# Patient Record
Sex: Male | Born: 1940 | Race: White | Hispanic: No | Marital: Married | Smoking: Former smoker
Health system: Southern US, Community
[De-identification: ages and names within clinical notes are randomized; demographics above are authoritative.]

## PROBLEM LIST (undated history)

## (undated) DIAGNOSIS — E079 Disorder of thyroid, unspecified: Secondary | ICD-10-CM

## (undated) DIAGNOSIS — I10 Essential (primary) hypertension: Secondary | ICD-10-CM

## (undated) DIAGNOSIS — F0280 Dementia in other diseases classified elsewhere without behavioral disturbance: Secondary | ICD-10-CM

## (undated) DIAGNOSIS — F0390 Unspecified dementia without behavioral disturbance: Secondary | ICD-10-CM

## (undated) DIAGNOSIS — N400 Enlarged prostate without lower urinary tract symptoms: Secondary | ICD-10-CM

## (undated) DIAGNOSIS — E7800 Pure hypercholesterolemia, unspecified: Secondary | ICD-10-CM

## (undated) DIAGNOSIS — G309 Alzheimer's disease, unspecified: Secondary | ICD-10-CM

---

## 1998-10-02 ENCOUNTER — Ambulatory Visit (HOSPITAL_COMMUNITY): Admission: RE | Admit: 1998-10-02 | Discharge: 1998-10-02 | Payer: Self-pay | Admitting: Family Medicine

## 2008-06-04 ENCOUNTER — Emergency Department (HOSPITAL_COMMUNITY): Admission: EM | Admit: 2008-06-04 | Discharge: 2008-06-04 | Payer: Self-pay | Admitting: Emergency Medicine

## 2011-05-28 LAB — DIFFERENTIAL
Basophils Absolute: 0
Basophils Relative: 0
Eosinophils Absolute: 0.2
Eosinophils Relative: 2
Lymphocytes Relative: 9 — ABNORMAL LOW
Lymphs Abs: 0.8
Monocytes Absolute: 0.4
Monocytes Relative: 4
Neutro Abs: 8.1 — ABNORMAL HIGH
Neutrophils Relative %: 85 — ABNORMAL HIGH

## 2011-05-28 LAB — BASIC METABOLIC PANEL WITH GFR
CO2: 26
Calcium: 8.7
Chloride: 105
Creatinine, Ser: 0.61
GFR calc non Af Amer: >60
Sodium: 139

## 2011-05-28 LAB — APTT: aPTT: 25

## 2011-05-28 LAB — PROTIME-INR
INR: 0.9
Prothrombin Time: 12.4

## 2011-05-28 LAB — BASIC METABOLIC PANEL
BUN: 11
GFR calc Af Amer: >60
Glucose, Bld: 95
Potassium: 3.6

## 2011-05-28 LAB — CBC
HCT: 43.3
Hemoglobin: 14.4
MCHC: 33.2
MCV: 92.1
Platelets: 273
RBC: 4.7
RDW: 13.6
WBC: 9.5

## 2011-05-28 LAB — POCT CARDIAC MARKERS
CKMB, poc: 1.1
CKMB, poc: <1 — ABNORMAL LOW
Myoglobin, poc: 40.3
Troponin i, poc: <0.05

## 2014-09-28 ENCOUNTER — Emergency Department (HOSPITAL_COMMUNITY)
Admission: EM | Admit: 2014-09-28 | Discharge: 2014-09-28 | Disposition: A | Discharge status: Home / Self Care | Payer: Medicare Other | Attending: Emergency Medicine | Admitting: Emergency Medicine

## 2014-09-28 ENCOUNTER — Emergency Department (HOSPITAL_COMMUNITY): Payer: Medicare Other

## 2014-09-28 ENCOUNTER — Encounter (HOSPITAL_COMMUNITY): Payer: Self-pay | Admitting: Emergency Medicine

## 2014-09-28 DIAGNOSIS — G309 Alzheimer's disease, unspecified: Secondary | ICD-10-CM | POA: Insufficient documentation

## 2014-09-28 DIAGNOSIS — N400 Enlarged prostate without lower urinary tract symptoms: Secondary | ICD-10-CM | POA: Diagnosis not present

## 2014-09-28 DIAGNOSIS — F0281 Dementia in other diseases classified elsewhere with behavioral disturbance: Secondary | ICD-10-CM | POA: Diagnosis not present

## 2014-09-28 DIAGNOSIS — E039 Hypothyroidism, unspecified: Secondary | ICD-10-CM | POA: Diagnosis not present

## 2014-09-28 DIAGNOSIS — Z87891 Personal history of nicotine dependence: Secondary | ICD-10-CM | POA: Insufficient documentation

## 2014-09-28 DIAGNOSIS — Z79899 Other long term (current) drug therapy: Secondary | ICD-10-CM | POA: Insufficient documentation

## 2014-09-28 DIAGNOSIS — I10 Essential (primary) hypertension: Secondary | ICD-10-CM | POA: Insufficient documentation

## 2014-09-28 DIAGNOSIS — E780 Pure hypercholesterolemia: Secondary | ICD-10-CM | POA: Diagnosis not present

## 2014-09-28 DIAGNOSIS — F0391 Unspecified dementia with behavioral disturbance: Secondary | ICD-10-CM

## 2014-09-28 DIAGNOSIS — E079 Disorder of thyroid, unspecified: Secondary | ICD-10-CM | POA: Diagnosis not present

## 2014-09-28 DIAGNOSIS — R7989 Other specified abnormal findings of blood chemistry: Secondary | ICD-10-CM | POA: Diagnosis present

## 2014-09-28 HISTORY — DX: Alzheimer's disease, unspecified: G30.9

## 2014-09-28 HISTORY — DX: Disorder of thyroid, unspecified: E07.9

## 2014-09-28 HISTORY — DX: Benign prostatic hyperplasia without lower urinary tract symptoms: N40.0

## 2014-09-28 HISTORY — DX: Unspecified dementia, unspecified severity, without behavioral disturbance, psychotic disturbance, mood disturbance, and anxiety: F03.90

## 2014-09-28 HISTORY — DX: Pure hypercholesterolemia, unspecified: E78.00

## 2014-09-28 HISTORY — DX: Essential (primary) hypertension: I10

## 2014-09-28 HISTORY — DX: Dementia in other diseases classified elsewhere, unspecified severity, without behavioral disturbance, psychotic disturbance, mood disturbance, and anxiety: F02.80

## 2014-09-28 LAB — CBC WITH DIFFERENTIAL/PLATELET
BASOS ABS: 0.1 10*3/uL (ref 0.0–0.1)
BASOS PCT: 1 % (ref 0–1)
EOS ABS: 0.3 10*3/uL (ref 0.0–0.7)
EOS PCT: 3 % (ref 0–5)
HCT: 43.5 % (ref 39.0–52.0)
Hemoglobin: 14.2 g/dL (ref 13.0–17.0)
Lymphocytes Relative: 18 % (ref 12–46)
Lymphs Abs: 1.5 10*3/uL (ref 0.7–4.0)
MCH: 30.1 pg (ref 26.0–34.0)
MCHC: 32.6 g/dL (ref 30.0–36.0)
MCV: 92.4 fL (ref 78.0–100.0)
MONOS PCT: 8 % (ref 3–12)
Monocytes Absolute: 0.7 10*3/uL (ref 0.1–1.0)
NEUTROS PCT: 70 % (ref 43–77)
Neutro Abs: 5.8 10*3/uL (ref 1.7–7.7)
PLATELETS: 278 10*3/uL (ref 150–400)
RBC: 4.71 MIL/uL (ref 4.22–5.81)
RDW: 14.1 % (ref 11.5–15.5)
WBC: 8.4 10*3/uL (ref 4.0–10.5)

## 2014-09-28 LAB — RAPID URINE DRUG SCREEN, HOSP PERFORMED
AMPHETAMINES: NOT DETECTED
BENZODIAZEPINES: POSITIVE — AB
Barbiturates: NOT DETECTED
COCAINE: NOT DETECTED
OPIATES: NOT DETECTED
TETRAHYDROCANNABINOL: NOT DETECTED

## 2014-09-28 LAB — ACETAMINOPHEN LEVEL: Acetaminophen (Tylenol), Serum: <10 ug/mL — ABNORMAL LOW (ref 10–30)

## 2014-09-28 LAB — COMPREHENSIVE METABOLIC PANEL
ALK PHOS: 71 U/L (ref 39–117)
ALT: 18 U/L (ref 0–53)
ANION GAP: 7 (ref 5–15)
AST: 21 U/L (ref 0–37)
Albumin: 3.9 g/dL (ref 3.5–5.2)
BILIRUBIN TOTAL: 0.7 mg/dL (ref 0.3–1.2)
BUN: 26 mg/dL — AB (ref 6–23)
CO2: 26 mmol/L (ref 19–32)
CREATININE: 0.76 mg/dL (ref 0.50–1.35)
Calcium: 8.4 mg/dL (ref 8.4–10.5)
Chloride: 105 mmol/L (ref 96–112)
GFR, EST NON AFRICAN AMERICAN: 88 mL/min — AB (ref >90)
Glucose, Bld: 115 mg/dL — ABNORMAL HIGH (ref 70–99)
POTASSIUM: 3.6 mmol/L (ref 3.5–5.1)
Sodium: 138 mmol/L (ref 135–145)
Total Protein: 7 g/dL (ref 6.0–8.3)

## 2014-09-28 LAB — URINALYSIS, ROUTINE W REFLEX MICROSCOPIC
BILIRUBIN URINE: NEGATIVE
GLUCOSE, UA: NEGATIVE mg/dL
Hgb urine dipstick: NEGATIVE
KETONES UR: NEGATIVE mg/dL
LEUKOCYTES UA: NEGATIVE
NITRITE: NEGATIVE
PH: 5 (ref 5.0–8.0)
PROTEIN: NEGATIVE mg/dL
SPECIFIC GRAVITY, URINE: 1.027 (ref 1.005–1.030)
UROBILINOGEN UA: 0.2 mg/dL (ref 0.0–1.0)

## 2014-09-28 LAB — ETHANOL: Alcohol, Ethyl (B): <5 mg/dL (ref 0–9)

## 2014-09-28 LAB — SALICYLATE LEVEL

## 2014-09-28 LAB — TSH: TSH: 260 u[IU]/mL — ABNORMAL HIGH (ref 0.350–4.500)

## 2014-09-28 MED ORDER — LEVOTHYROXINE SODIUM 150 MCG PO TABS: 150.0000 ug | ORAL_TABLET | Freq: Every day | ORAL | Status: DC

## 2014-09-28 NOTE — ED Notes (Signed)
Pt wife states her husband's doctor's office called and told them to report to the ER d/t elevated thyroid levels. Wife states her husband is having worsening problems with wandering off from his dementia, but is not combative or agitated.

## 2014-09-28 NOTE — Discharge Instructions (Signed)
Change synthroid to 150 mcg daily. Please make sure that he takes his meds.   Get repeat TSH in a month.   See your doctor.   Return to ER if you have worse aggressive behavior, not behaving normally.

## 2014-09-28 NOTE — Progress Notes (Signed)
  CARE MANAGEMENT ED NOTE 09/28/2014  Patient:  Steve Hood,Steve Hood   Account Number:  [id number]  Date Initiated:  09/28/2014  Documentation initiated by:  Edd ArbourGIBBS,Dejanira Pamintuan  Subjective/Objective Assessment:   74 yr old self pay [place] pt doctor's office called and told them to report to the ER d/t elevated thyroid levels. Wife states her husband is having worsening problems with wandering off from his dementia, but is not combative or a     Subjective/Objective Assessment Detail:   Family informed cm they had an appointment with home instead but had to come to the emergency room  pcp is [facility]  Pt supported by his 2 male and 1 male family member     Action/Plan:   consulted by ED RN for assist with PDN & possible home health Cm spoke with family see notes below pcp updated   Action/Plan Detail:   Anticipated DC Date:       Status Recommendation to Physician:   Result of Recommendation:    Other ED Services  Consult Working Plan    DC [employer]  Other  Outpatient Services - Pt will follow up    Choice offered to / List presented to:            Status of service:  Completed, signed off  ED Comments:   ED Comments Detail:  CM reviewed in details medicare guidelines, home health (HH) (length of stay in home, [facility], coverage, primary caregiver, up to 24 hrs before services may be started), Private duty nursing (PDN-coverage, length of stay in the home types of staff available),  CM provided family with a list of [place] PDN. Refused home health service information

## 2014-09-28 NOTE — ED Provider Notes (Addendum)
CSN: [id number]     Arrival date & time 09/28/14  1453 History   First MD Initiated Contact with Patient 09/28/14 1505     Chief Complaint  Patient presents with  . Abnormal labs      (Consider location/radiation/quality/duration/timing/severity/associated sxs/prior Treatment) The history is provided by the patient and the spouse.  Larnell Netta NeatJ Menden is a 74 y.o. male hx of HTN, dementia, hypothyroidism here with abnormal labs. He had a physical end of December. Had thyroid test done last week and was abnormal. PMD called patient and sent patient to the ED for evaluation. Patient has dementia and lives at home with wife. She states that he has been more agitated at home. However, he is not aggressive towards her and hasn't destroyed any properties. He has been more forgetful recently. He did wander outside of the home recently. He has been refusing to go to see his doctors.     Past Medical History  Diagnosis Date  . Hypertension   . Thyroid disease   . Dementia alzh  . Alzheimer disease   . Benign prostate hyperplasia   . High cholesterol    History reviewed. No pertinent past surgical history. History reviewed. No pertinent family history. History  Substance Use Topics  . Smoking status: Former Games developermoker  . Smokeless tobacco: Not on file  . Alcohol Use: Not on file    Review of Systems  Psychiatric/Behavioral: Positive for behavioral problems and confusion.  All other systems reviewed and are negative.     Allergies  Review of patient's allergies indicates no known allergies.  Home Medications   Prior to Admission medications   Medication Sig Start Date End Date Taking? Authorizing Provider  ALPRAZolam Prudy Feeler(XANAX) 1 MG tablet Take 1 mg by mouth 2 (two) times daily as needed for anxiety.  08/11/14  Yes Historical Provider, MD  dutasteride (AVODART) 0.5 MG capsule Take 0.5 mg by mouth at bedtime.  06/04/13  Yes Historical Provider, MD  levothyroxine (SYNTHROID, LEVOTHROID) 112  MCG tablet Take 112 mcg by mouth daily before breakfast.  09/22/14  Yes Historical Provider, MD  lisinopril-hydrochlorothiazide (PRINZIDE,ZESTORETIC) 20-12.5 MG per tablet Take 1 tablet by mouth at bedtime.  06/23/14  Yes Historical Provider, MD  simvastatin (ZOCOR) 20 MG tablet Take 20 mg by mouth at bedtime.  06/23/14  Yes Historical Provider, MD  zolpidem (AMBIEN) 10 MG tablet Take 10 mg by mouth at bedtime as needed for sleep.  09/10/14  Yes Historical Provider, MD   BP 144/84 mmHg  Pulse 102  Temp(Src) 98 F (36.7 C) (Oral)  Resp 18  SpO2 96% Physical Exam  Constitutional:  Demented, NAD   HENT:  Head: Normocephalic.  Mouth/Throat: Oropharynx is clear and moist.  Eyes: Conjunctivae are normal. Pupils are equal, round, and reactive to light.  Neck: Normal range of motion. Neck supple.  Cardiovascular: Normal rate, regular rhythm, normal heart sounds and intact distal pulses.   Pulmonary/Chest: Effort normal and breath sounds normal. No respiratory distress. He has no wheezes. He has no rales.  Abdominal: Soft. Bowel sounds are normal. He exhibits no distension. There is no tenderness. There is no rebound and no guarding.  Musculoskeletal: Normal range of motion. He exhibits no edema or tenderness.  Neurological: He is alert.  Demented, nl strength throughout.   Skin: Skin is warm and dry.  Psychiatric:  Calm, NAD   Nursing note and vitals reviewed.   ED Course  Procedures (including critical care time) Labs Review Labs Reviewed  COMPREHENSIVE METABOLIC PANEL - Abnormal; Notable for the following:    Glucose, Bld 115 (*)    BUN 26 (*)    GFR calc non Af Amer 88 (*)    All other components within normal limits  ACETAMINOPHEN LEVEL - Abnormal; Notable for the following:    Acetaminophen (Tylenol), Serum <10.0 (*)    All other components within normal limits  CBC WITH DIFFERENTIAL/PLATELET  URINALYSIS, ROUTINE W REFLEX MICROSCOPIC  ETHANOL  SALICYLATE LEVEL  TSH  T4,  FREE  T3  URINE RAPID DRUG SCREEN (HOSP PERFORMED)    Imaging Review Dg Chest 2 View  09/28/2014   CLINICAL DATA:  74 year old male with dementia.  EXAM: CHEST  2 VIEW  COMPARISON:  Chest x-ray 06/04/2008.  FINDINGS: Mild diffuse peribronchial cuffing. Lung volumes are low. No consolidative airspace disease. No pleural effusions. No pneumothorax. No pulmonary nodule or mass noted. Pulmonary vasculature and the cardiomediastinal silhouette are within normal limits.  IMPRESSION: 1. Mild diffuse peribronchial cuffing may suggest bronchitis. 2. Low lung volumes.   Electronically Signed   By: Trudie Reed M.D.   On: 09/28/2014 15:57   Ct Head Wo Contrast  09/28/2014   CLINICAL DATA:  Altered mental status with progressive dementia. Thyrotoxicosis. Initial encounter.  EXAM: CT HEAD WITHOUT CONTRAST  TECHNIQUE: Contiguous axial images were obtained from the base of the skull through the vertex without intravenous contrast.  COMPARISON:  None.  FINDINGS: There is no evidence of acute intracranial hemorrhage, mass lesion, brain edema or extra-axial fluid collection. The ventricles and subarachnoid spaces are mildly prominent. There is no CT evidence of acute cortical infarction. There is minimal periventricular white matter disease, likely reflecting chronic small vessel ischemic change. Intracranial vascular calcifications are noted.  The visualized paranasal sinuses, mastoid air cells and middle ears are clear. The calvarium is intact.  IMPRESSION: Mild atrophy and periventricular white matter disease. No acute intracranial findings.   Electronically Signed   By: Roxy Horseman M.D.   On: 09/28/2014 15:59     EKG Interpretation None      MDM   Final diagnoses:  None    HAMILTON MARINELLO is a 74 y.o. male here with elevated TSH. I called Dr. Modesta Messing office and discussed with her. Recent TSH was 249 then repeat was 310. There is no T3 or T4 sent. She feels that he has been more agitated at home and  refused neuro eval and recommend MRI. I called Dr. Thad Ranger, neurology, who states that given abnormal TSH, there is no need for further neuro workup until the TSH normalized. I called Dr. Tedd Sias, endocrinology,who recommend increase synthroid to 150 mcg daily. She states that he is likely not taking his meds. I offered admission but wife doesn't want him placed. She feels safe at home. She wants to take him home. She is getting home health to evaluate and help her at home. Labs at baseline. Will dc home.    Richardean Canal, MD 09/28/14 1821  09/29/14 3pm I followed up labs I sent yesterday. TSH 260. T3 and free T4 nl. No signs of myedema coma on my exam yesterday. I have already increased his synthroid. He can get repeat level checked in 3-4 weeks.      Richardean Canal, MD 09/29/14 1500

## 2014-09-29 LAB — T4, FREE: Free T4: 1.37 ng/dL (ref 0.80–1.80)

## 2014-09-29 LAB — T3: T3 TOTAL: 81 ng/dL (ref 71–180)

## 2014-12-05 ENCOUNTER — Encounter (HOSPITAL_COMMUNITY): Payer: Self-pay | Admitting: *Deleted

## 2014-12-05 ENCOUNTER — Emergency Department (HOSPITAL_COMMUNITY)
Admission: EM | Admit: 2014-12-05 | Discharge: 2014-12-06 | Disposition: A | Discharge status: Other Acute Inpatient Hospital | Payer: Medicare Other | Attending: Emergency Medicine | Admitting: Emergency Medicine

## 2014-12-05 DIAGNOSIS — Z87891 Personal history of nicotine dependence: Secondary | ICD-10-CM | POA: Diagnosis not present

## 2014-12-05 DIAGNOSIS — R4689 Other symptoms and signs involving appearance and behavior: Secondary | ICD-10-CM

## 2014-12-05 DIAGNOSIS — G309 Alzheimer's disease, unspecified: Secondary | ICD-10-CM | POA: Insufficient documentation

## 2014-12-05 DIAGNOSIS — N400 Enlarged prostate without lower urinary tract symptoms: Secondary | ICD-10-CM | POA: Diagnosis not present

## 2014-12-05 DIAGNOSIS — E079 Disorder of thyroid, unspecified: Secondary | ICD-10-CM | POA: Diagnosis not present

## 2014-12-05 DIAGNOSIS — F0391 Unspecified dementia with behavioral disturbance: Secondary | ICD-10-CM | POA: Diagnosis not present

## 2014-12-05 DIAGNOSIS — F1310 Sedative, hypnotic or anxiolytic abuse, uncomplicated: Secondary | ICD-10-CM | POA: Diagnosis not present

## 2014-12-05 DIAGNOSIS — F911 Conduct disorder, childhood-onset type: Secondary | ICD-10-CM | POA: Insufficient documentation

## 2014-12-05 DIAGNOSIS — I10 Essential (primary) hypertension: Secondary | ICD-10-CM | POA: Diagnosis not present

## 2014-12-05 DIAGNOSIS — F0280 Dementia in other diseases classified elsewhere without behavioral disturbance: Secondary | ICD-10-CM

## 2014-12-05 DIAGNOSIS — E780 Pure hypercholesterolemia: Secondary | ICD-10-CM | POA: Diagnosis not present

## 2014-12-05 DIAGNOSIS — Z046 Encounter for general psychiatric examination, requested by authority: Secondary | ICD-10-CM | POA: Diagnosis present

## 2014-12-05 DIAGNOSIS — F03918 Unspecified dementia, unspecified severity, with other behavioral disturbance: Secondary | ICD-10-CM | POA: Diagnosis present

## 2014-12-05 DIAGNOSIS — Z79899 Other long term (current) drug therapy: Secondary | ICD-10-CM | POA: Diagnosis not present

## 2014-12-05 LAB — COMPREHENSIVE METABOLIC PANEL
ALK PHOS: 73 U/L (ref 39–117)
ALT: 20 U/L (ref 0–53)
AST: 23 U/L (ref 0–37)
Albumin: 4.2 g/dL (ref 3.5–5.2)
Anion gap: 8 (ref 5–15)
BILIRUBIN TOTAL: 0.8 mg/dL (ref 0.3–1.2)
BUN: 11 mg/dL (ref 6–23)
CO2: 26 mmol/L (ref 19–32)
Calcium: 8.7 mg/dL (ref 8.4–10.5)
Chloride: 100 mmol/L (ref 96–112)
Creatinine, Ser: 0.62 mg/dL (ref 0.50–1.35)
GFR calc Af Amer: >90 mL/min (ref >90)
GFR calc non Af Amer: >90 mL/min (ref >90)
Glucose, Bld: 116 mg/dL — ABNORMAL HIGH (ref 70–99)
POTASSIUM: 4 mmol/L (ref 3.5–5.1)
SODIUM: 134 mmol/L — AB (ref 135–145)
Total Protein: 7.6 g/dL (ref 6.0–8.3)

## 2014-12-05 LAB — URINALYSIS, ROUTINE W REFLEX MICROSCOPIC
BILIRUBIN URINE: NEGATIVE
GLUCOSE, UA: NEGATIVE mg/dL
HGB URINE DIPSTICK: NEGATIVE
Ketones, ur: 15 mg/dL — AB
Leukocytes, UA: NEGATIVE
NITRITE: NEGATIVE
Protein, ur: NEGATIVE mg/dL
Specific Gravity, Urine: 1.012 (ref 1.005–1.030)
Urobilinogen, UA: 1 mg/dL (ref 0.0–1.0)
pH: 7 (ref 5.0–8.0)

## 2014-12-05 LAB — RAPID URINE DRUG SCREEN, HOSP PERFORMED
AMPHETAMINES: NOT DETECTED
BARBITURATES: NOT DETECTED
BENZODIAZEPINES: POSITIVE — AB
Cocaine: NOT DETECTED
Opiates: NOT DETECTED
Tetrahydrocannabinol: NOT DETECTED

## 2014-12-05 LAB — SALICYLATE LEVEL

## 2014-12-05 LAB — CBC
HCT: 43 % (ref 39.0–52.0)
Hemoglobin: 14.3 g/dL (ref 13.0–17.0)
MCH: 30.7 pg (ref 26.0–34.0)
MCHC: 33.3 g/dL (ref 30.0–36.0)
MCV: 92.3 fL (ref 78.0–100.0)
PLATELETS: 309 10*3/uL (ref 150–400)
RBC: 4.66 MIL/uL (ref 4.22–5.81)
RDW: 13.2 % (ref 11.5–15.5)
WBC: 14.5 10*3/uL — AB (ref 4.0–10.5)

## 2014-12-05 LAB — ACETAMINOPHEN LEVEL: Acetaminophen (Tylenol), Serum: <10 ug/mL — ABNORMAL LOW (ref 10–30)

## 2014-12-05 LAB — ETHANOL: Alcohol, Ethyl (B): <5 mg/dL (ref 0–9)

## 2014-12-05 MED ORDER — SIMVASTATIN 20 MG PO TABS
20.0000 mg | ORAL_TABLET | Freq: Every day | ORAL | Status: DC
  Administered 2014-12-05: 20 mg via ORAL
  Filled 2014-12-05 (×3): qty 1

## 2014-12-05 MED ORDER — LEVOTHYROXINE SODIUM 150 MCG PO TABS
150.0000 ug | ORAL_TABLET | Freq: Every day | ORAL | Status: DC
  Administered 2014-12-06: 150 ug via ORAL
  Filled 2014-12-05 (×2): qty 1

## 2014-12-05 MED ORDER — DUTASTERIDE 0.5 MG PO CAPS
0.5000 mg | ORAL_CAPSULE | Freq: Every day | ORAL | Status: DC
  Administered 2014-12-05: 0.5 mg via ORAL
  Filled 2014-12-05 (×3): qty 1

## 2014-12-05 MED ORDER — ZOLPIDEM TARTRATE 10 MG PO TABS: 10.0000 mg | ORAL_TABLET | Freq: Every evening | ORAL | Status: DC | PRN

## 2014-12-05 MED ORDER — LISINOPRIL-HYDROCHLOROTHIAZIDE 20-12.5 MG PO TABS: 1.0000 | ORAL_TABLET | Freq: Every day | ORAL | Status: DC

## 2014-12-05 MED ORDER — ONDANSETRON HCL 4 MG PO TABS: 4.0000 mg | ORAL_TABLET | Freq: Three times a day (TID) | ORAL | Status: DC | PRN

## 2014-12-05 MED ORDER — TRAZODONE HCL 100 MG PO TABS
100.0000 mg | ORAL_TABLET | Freq: Every day | ORAL | Status: DC
  Administered 2014-12-05: 100 mg via ORAL
  Filled 2014-12-05: qty 1

## 2014-12-05 MED ORDER — ALPRAZOLAM 0.5 MG PO TABS
1.0000 mg | ORAL_TABLET | Freq: Two times a day (BID) | ORAL | Status: DC | PRN
  Administered 2014-12-05: 1 mg via ORAL
  Filled 2014-12-05: qty 2

## 2014-12-05 MED ORDER — HYDROCHLOROTHIAZIDE 12.5 MG PO CAPS
12.5000 mg | ORAL_CAPSULE | Freq: Every day | ORAL | Status: DC
  Administered 2014-12-05 – 2014-12-06 (×2): 12.5 mg via ORAL
  Filled 2014-12-05 (×2): qty 1

## 2014-12-05 MED ORDER — LISINOPRIL 20 MG PO TABS
20.0000 mg | ORAL_TABLET | Freq: Every day | ORAL | Status: DC
  Administered 2014-12-05 – 2014-12-06 (×2): 20 mg via ORAL
  Filled 2014-12-05 (×2): qty 1

## 2014-12-05 MED ORDER — ACETAMINOPHEN 325 MG PO TABS: 650.0000 mg | ORAL_TABLET | ORAL | Status: DC | PRN
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
## 2014-12-05 NOTE — ED Notes (Signed)
[facility] Expected date:  Expected time:  Means of arrival:  Comments: ivc

## 2014-12-05 NOTE — BH Assessment (Signed)
Assessment completed. Consulted Donell SievertSpencer Simon, PA-C who recommended that pt have a psych eval in the morning. Dr. Blinda LeatherwoodPollina has been informed of the recommendation.
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
## 2014-12-06 DIAGNOSIS — G309 Alzheimer's disease, unspecified: Secondary | ICD-10-CM | POA: Diagnosis not present

## 2014-12-06 DIAGNOSIS — F0391 Unspecified dementia with behavioral disturbance: Secondary | ICD-10-CM

## 2014-12-06 DIAGNOSIS — R4689 Other symptoms and signs involving appearance and behavior: Secondary | ICD-10-CM | POA: Insufficient documentation

## 2014-12-06 DIAGNOSIS — F03918 Unspecified dementia, unspecified severity, with other behavioral disturbance: Secondary | ICD-10-CM | POA: Diagnosis present

## 2014-12-06 MED ORDER — ALPRAZOLAM 0.5 MG PO TABS
0.5000 mg | ORAL_TABLET | Freq: Two times a day (BID) | ORAL | Status: DC | PRN
  Administered 2014-12-06: 0.5 mg via ORAL
  Filled 2014-12-06: qty 1

## 2014-12-06 MED ORDER — CITALOPRAM HYDROBROMIDE 10 MG PO TABS
10.0000 mg | ORAL_TABLET | Freq: Every day | ORAL | Status: DC
  Administered 2014-12-06: 10 mg via ORAL
  Filled 2014-12-06: qty 1

## 2014-12-06 MED ORDER — TRAZODONE HCL 50 MG PO TABS: 50.0000 mg | ORAL_TABLET | Freq: Every day | ORAL | Status: DC
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
## 2014-12-06 NOTE — Progress Notes (Signed)
TTS Counselor faxed referral information to the following facilities for this patient: [facility] [address] [facility] [facility]. This information is also reflected on the shift report.

## 2014-12-06 NOTE — ED Notes (Signed)
Daughter at bedside.

## 2014-12-06 NOTE — ED Notes (Signed)
Pt continues to get out of bed. Attempting distracting activities. Pt will follow direct commands but needs constant reminders.
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
## 2014-12-06 NOTE — Progress Notes (Addendum)
[facility] Assessment Progress Note  Faxed copy of 1st opinion, chest x-ray, and [facility], attn [facility], per her request for further consideration for pt placement.    [facility] been [facility]. Accepting physician is Circuit CityBen Palombo. Number to call report is [phone]. Information given to pt's nurse, Shanda BumpsJessica.   Marcelle SmilingSamantha Maleya Leever, MS Counselor

## 2014-12-06 NOTE — ED Notes (Signed)
Pt alert laughing and dancing to music. Sitter remains at side for safety.

## 2014-12-06 NOTE — ED Notes (Signed)
Pt's wife has called to check on pt.  Will try and come by later this am.

## 2014-12-06 NOTE — ED Notes (Signed)
Pt ambulating in room. Concerned about when wife will return. Attempt comfort pt. Appears agitated.

## 2014-12-06 NOTE — ED Notes (Signed)
Pt came out of room and stated he was going to see his God.  Pt redirected to his room.  Safety sitter remains in room with patient.

## 2014-12-06 NOTE — ED Provider Notes (Signed)
  Physical Exam  BP 124/84 mmHg  Pulse 88  Temp(Src) 98.3 F (36.8 C) (Oral)  Resp 15  SpO2 97%  Physical Exam  ED Course  Procedures  [facility], Dr Nicanor AlconPalumbo.      Benjiman CoreNathan Maelys Kinnick, MD 12/06/14 [phone]

## 2014-12-06 NOTE — ED Notes (Signed)
Updated family on plan of care. Wife at bedside. Pt alert and pleasant.

## 2016-01-13 ENCOUNTER — Encounter (HOSPITAL_COMMUNITY): Payer: Self-pay | Admitting: Emergency Medicine

## 2016-01-13 ENCOUNTER — Emergency Department (HOSPITAL_COMMUNITY): Payer: Medicare Other

## 2016-01-13 ENCOUNTER — Emergency Department (HOSPITAL_COMMUNITY)
Admission: EM | Admit: 2016-01-13 | Discharge: 2016-01-14 | Disposition: A | Discharge status: Home / Self Care | Payer: Medicare Other | Attending: Emergency Medicine | Admitting: Emergency Medicine

## 2016-01-13 DIAGNOSIS — F0280 Dementia in other diseases classified elsewhere without behavioral disturbance: Secondary | ICD-10-CM | POA: Diagnosis not present

## 2016-01-13 DIAGNOSIS — Y9289 Other specified places as the place of occurrence of the external cause: Secondary | ICD-10-CM | POA: Insufficient documentation

## 2016-01-13 DIAGNOSIS — I10 Essential (primary) hypertension: Secondary | ICD-10-CM | POA: Insufficient documentation

## 2016-01-13 DIAGNOSIS — W1830XA Fall on same level, unspecified, initial encounter: Secondary | ICD-10-CM | POA: Diagnosis not present

## 2016-01-13 DIAGNOSIS — Z79899 Other long term (current) drug therapy: Secondary | ICD-10-CM | POA: Diagnosis not present

## 2016-01-13 DIAGNOSIS — Y999 Unspecified external cause status: Secondary | ICD-10-CM | POA: Insufficient documentation

## 2016-01-13 DIAGNOSIS — Y939 Activity, unspecified: Secondary | ICD-10-CM | POA: Diagnosis not present

## 2016-01-13 DIAGNOSIS — G309 Alzheimer's disease, unspecified: Secondary | ICD-10-CM | POA: Insufficient documentation

## 2016-01-13 DIAGNOSIS — Z87891 Personal history of nicotine dependence: Secondary | ICD-10-CM | POA: Insufficient documentation

## 2016-01-13 DIAGNOSIS — F0390 Unspecified dementia without behavioral disturbance: Secondary | ICD-10-CM | POA: Diagnosis present

## 2016-01-13 DIAGNOSIS — W19XXXA Unspecified fall, initial encounter: Secondary | ICD-10-CM

## 2016-01-13 NOTE — Discharge Instructions (Signed)
°  Your CT scan does not show serious head or neck injury.  Return for worsening symptoms, including confusion, inability to ambulate, or any symptoms concerning to you.

## 2016-01-13 NOTE — ED Notes (Signed)
Pt comes from a facility off [address], [facility], pt had a un witnessed fall 30 mins ago, pt denies pain, hx dementia, able to ambulate with walker. Ems placed spinal precautions, baseline is per staff. Pt is able to answer yes or no questions.  Vs on arrival 96/62, hr 80, sp02 95,

## 2016-01-13 NOTE — ED Provider Notes (Signed)
CSN: [id number]     Arrival date & time 01/13/16  2100 History   First MD Initiated Contact with Patient 01/13/16 2132     Chief Complaint  Patient presents with  . Fall  . Dementia    baseline      (Consider location/radiation/quality/duration/timing/severity/associated sxs/prior Treatment) HPI  Level V caveat due to dementia.  75 year old male who presents after fall. He has a history of hypertension and Alzheimer's dementia. He does not take any anticoagulation. According to EMS, patient presents from [facility]. He was found on the ground by nursing staff and had unwitnessed fall. He is at his baseline mental status per staff. He states that he remembers falling, but does not recall the details around this. He denies headache, numbness or weakness, neck pain, nausea or vomiting, vision or speech changes, shortness of breath, chest pain, abdominal pain, back pain, or any injury.    Past Medical History  Diagnosis Date  . Hypertension   . Thyroid disease   . Dementia alzh  . Alzheimer disease   . Benign prostate hyperplasia   . High cholesterol    History reviewed. No pertinent past surgical history. History reviewed. No pertinent family history. Social History  Substance Use Topics  . Smoking status: Former Games developermoker  . Smokeless tobacco: None  . Alcohol Use: None    Review of Systems 10/14 systems reviewed and are negative other than those stated in the HPI    Allergies  Review of patient's allergies indicates no known allergies.  Home Medications   Prior to Admission medications   Medication Sig Start Date End Date Taking? Authorizing Provider  ALPRAZolam Prudy Feeler(XANAX) 1 MG tablet Take 1 mg by mouth 3 (three) times daily as needed for anxiety (anxiety).  08/11/14  Yes Historical Provider, MD  divalproex (DEPAKOTE SPRINKLE) 125 MG capsule Take 375 mg by mouth 4 (four) times daily -  with meals and at bedtime.    Yes Historical Provider, MD  docusate sodium  (COLACE) 100 MG capsule Take 100 mg by mouth 2 (two) times daily.   Yes Historical Provider, MD  finasteride (PROSCAR) 5 MG tablet Take 5 mg by mouth daily.   Yes Historical Provider, MD  furosemide (LASIX) 20 MG tablet Take 10 mg by mouth daily.   Yes Historical Provider, MD  levothyroxine (SYNTHROID, LEVOTHROID) 125 MCG tablet Take 125 mcg by mouth daily before breakfast.   Yes Historical Provider, MD  lisinopril (PRINIVIL,ZESTRIL) 5 MG tablet Take 5 mg by mouth daily.   Yes Historical Provider, MD  mirtazapine (REMERON) 30 MG tablet Take 30 mg by mouth at bedtime.   Yes Historical Provider, MD  risperiDONE (RISPERDAL) 0.5 MG tablet Take 0.5 mg by mouth 2 (two) times daily.   Yes Historical Provider, MD  simvastatin (ZOCOR) 20 MG tablet Take 20 mg by mouth at bedtime.  06/23/14  Yes Historical Provider, MD  tamsulosin (FLOMAX) 0.4 MG CAPS capsule Take 0.4 mg by mouth daily.   Yes Historical Provider, MD  levothyroxine (SYNTHROID) 150 MCG tablet Take 1 tablet (150 mcg total) by mouth daily before breakfast. Patient not taking: Reported on 01/13/2016 09/28/14   Richardean Canalavid H Yao, MD   BP 96/61 mmHg  Pulse 84  Temp(Src) 98.3 F (36.8 C) (Oral)  Resp 16  SpO2 93% Physical Exam Physical Exam  Nursing note and vitals reviewed. Constitutional: Well developed, well nourished, non-toxic, and in no acute distress Head: Normocephalic and atraumatic.  Mouth/Throat: Oropharynx is clear and moist.  Neck:  Normal range of motion. Neck supple. No cervical spine tenderness. Cardiovascular: Normal rate and regular rhythm.   Pulmonary/Chest: Effort normal and breath sounds normal. No chest wall tenderness. Abdominal: Soft. There is no tenderness. There is no rebound and no guarding.  Musculoskeletal: Normal range of motion.  Neurological: Alert, no facial droop, fluent speech, moves all extremities symmetrically Skin: Skin is warm and dry.  Psychiatric: Cooperative  ED Course  Procedures (including critical  care time) Labs Review Labs Reviewed - No data to display  Imaging Review No results found. I have personally reviewed and evaluated these images and lab results as part of my medical decision-making.   EKG Interpretation None      MDM   Final diagnoses:  Fall, initial encounter  Alzheimer's dementia without behavioral disturbance, unspecified timing of dementia onset    75 year old male with history of Alzheimer's dementia who presents after unwitnessed fall. On presentation, he is well-appearing and in no acute distress. Without evidence of any severe injury. he does have cervical collar on. he is at his baseline mental status per family. We'll obtain CT head and cervical spine given age. No other injuries noted or suspected. Imaging studies signed out to oncoming physician. IF imaging studies negative and patient able to ambulate, will be discharged back to facility.   Lavera Guise, MD 01/14/16 [phone]

## 2016-01-14 NOTE — ED Provider Notes (Signed)
Pt with negative Ct imaging He is able to ambulate No acute issues at this time Will d/c home  Zadie Rhineonald Jilliana Burkes, MD 01/14/16 0139

## 2016-02-21 ENCOUNTER — Emergency Department (HOSPITAL_COMMUNITY)
Admission: EM | Admit: 2016-02-21 | Discharge: 2016-02-21 | Disposition: A | Discharge status: Home / Self Care | Payer: Medicare Other | Attending: Emergency Medicine | Admitting: Emergency Medicine

## 2016-02-21 ENCOUNTER — Encounter (HOSPITAL_COMMUNITY): Payer: Self-pay | Admitting: Emergency Medicine

## 2016-02-21 DIAGNOSIS — I10 Essential (primary) hypertension: Secondary | ICD-10-CM | POA: Insufficient documentation

## 2016-02-21 DIAGNOSIS — W19XXXA Unspecified fall, initial encounter: Secondary | ICD-10-CM | POA: Diagnosis not present

## 2016-02-21 DIAGNOSIS — Y939 Activity, unspecified: Secondary | ICD-10-CM | POA: Insufficient documentation

## 2016-02-21 DIAGNOSIS — G309 Alzheimer's disease, unspecified: Secondary | ICD-10-CM | POA: Insufficient documentation

## 2016-02-21 DIAGNOSIS — Y999 Unspecified external cause status: Secondary | ICD-10-CM | POA: Insufficient documentation

## 2016-02-21 DIAGNOSIS — Y929 Unspecified place or not applicable: Secondary | ICD-10-CM | POA: Insufficient documentation

## 2016-02-21 DIAGNOSIS — Z87891 Personal history of nicotine dependence: Secondary | ICD-10-CM | POA: Insufficient documentation

## 2016-02-21 DIAGNOSIS — Z048 Encounter for examination and observation for other specified reasons: Secondary | ICD-10-CM | POA: Diagnosis present

## 2016-02-21 DIAGNOSIS — E860 Dehydration: Secondary | ICD-10-CM | POA: Diagnosis not present

## 2016-02-21 DIAGNOSIS — Z79899 Other long term (current) drug therapy: Secondary | ICD-10-CM | POA: Insufficient documentation

## 2016-02-21 LAB — I-STAT CHEM 8, ED
BUN: 30 mg/dL — AB (ref 6–20)
Calcium, Ion: 1.17 mmol/L (ref 1.12–1.23)
Chloride: 107 mmol/L (ref 101–111)
Creatinine, Ser: 0.8 mg/dL (ref 0.61–1.24)
GLUCOSE: 88 mg/dL (ref 65–99)
HCT: 38 % — ABNORMAL LOW (ref 39.0–52.0)
HEMOGLOBIN: 12.9 g/dL — AB (ref 13.0–17.0)
Potassium: 4.6 mmol/L (ref 3.5–5.1)
Sodium: 144 mmol/L (ref 135–145)
TCO2: 29 mmol/L (ref 0–100)

## 2016-02-21 MED ORDER — SODIUM CHLORIDE 0.9 % IV BOLUS (SEPSIS)
500.0000 mL | Freq: Once | INTRAVENOUS | Status: AC
  Administered 2016-02-21: 500 mL via INTRAVENOUS

## 2016-02-21 NOTE — ED Notes (Signed)
Bed: [id number] Expected date:  Expected time:  Means of arrival:  Comments: EMS- 75yo M, fall/no complaint/dementia

## 2016-02-21 NOTE — ED Notes (Signed)
Patient able to stand and take a few steps with staff assist. Patient normally ambulates with a walker. Denies pain with activity.

## 2016-02-21 NOTE — Discharge Instructions (Signed)
Dehydration, Adult °Dehydration is a condition in which you do not have enough fluid or water in your body. It happens when you take in less fluid than you lose. Vital organs such as the kidneys, brain, and heart cannot function without a proper amount of fluids. Any loss of fluids from the body can cause dehydration.  °Dehydration can range from mild to severe. This condition should be treated right away to help prevent it from becoming severe. °CAUSES  °This condition may be caused by: °· Vomiting. °· Diarrhea. °· Excessive sweating, such as when exercising in hot or humid weather. °· Not drinking enough fluid during strenuous exercise or during an illness. °· Excessive urine output. °· Fever. °· Certain medicines. °RISK FACTORS °This condition is more likely to develop in: °· People who are taking certain medicines that cause the body to lose excess fluid (diuretics).   °· People who have a chronic illness, such as diabetes, that may increase urination. °· Older adults.   °· People who live at high altitudes.   °· People who participate in endurance sports.   °SYMPTOMS  °Mild Dehydration °· Thirst. °· Dry lips. °· Slightly dry mouth. °· Dry, warm skin. °Moderate Dehydration °· Very dry mouth.   °· Muscle cramps.   °· Dark urine and decreased urine production.   °· Decreased tear production.   °· Headache.   °· Light-headedness, especially when you stand up from a sitting position.   °Severe Dehydration °· Changes in skin.   °¨ Cold and clammy skin.   °¨ Skin does not spring back quickly when lightly pinched and released.   °· Changes in body fluids.   °¨ Extreme thirst.   °¨ No tears.   °¨ Not able to sweat when body temperature is high, such as in hot weather.   °¨ Minimal urine production.   °· Changes in vital signs.   °¨ Rapid, weak pulse (more than 100 beats per minute when you are sitting still).   °¨ Rapid breathing.   °¨ Low blood pressure.   °· Other changes.   °¨ Sunken eyes.   °¨ Cold hands and feet.    °¨ Confusion. °¨ Lethargy and difficulty being awakened. °¨ Fainting (syncope).   °¨ Short-term weight loss.   °¨ Unconsciousness. °DIAGNOSIS  °This condition may be diagnosed based on your symptoms. You may also have tests to determine how severe your dehydration is. These tests may include:  °· Urine tests.   °· Blood tests.   °TREATMENT  °Treatment for this condition depends on the severity. Mild or moderate dehydration can often be treated at home. Treatment should be started right away. Do not wait until dehydration becomes severe. Severe dehydration needs to be treated at the hospital. °Treatment for Mild Dehydration °· Drinking plenty of water to replace the fluid you have lost.   °· Replacing minerals in your blood (electrolytes) that you may have lost.   °Treatment for Moderate Dehydration  °· Consuming oral rehydration solution (ORS). °Treatment for Severe Dehydration °· Receiving fluid through an IV tube.   °· Receiving electrolyte solution through a feeding tube that is passed through your nose and into your stomach (nasogastric tube or NG tube). °· Correcting any abnormalities in electrolytes. °HOME CARE INSTRUCTIONS  °· Drink enough fluid to keep your urine clear or pale yellow.   °· Drink water or fluid slowly by taking small sips. You can also try sucking on ice cubes.  °· Have food or beverages that contain electrolytes. Examples include bananas and sports drinks. °· Take over-the-counter and prescription medicines only as told by your health care provider.   °· Prepare ORS according to the manufacturer's instructions. Take sips   of ORS every 5 minutes until your urine returns to normal.  If you have vomiting or diarrhea, continue to try to drink water, ORS, or both.   If you have diarrhea, avoid:   Beverages that contain caffeine.   Fruit juice.   Milk.   Carbonated soft drinks.  Do not take salt tablets. This can lead to the condition of having too much sodium in your body  (hypernatremia).  SEEK MEDICAL CARE IF:  You cannot eat or drink without vomiting.  You have had moderate diarrhea during a period of more than 24 hours.  You have a fever. SEEK IMMEDIATE MEDICAL CARE IF:   You have extreme thirst.  You have severe diarrhea.  You have not urinated in 6-8 hours, or you have urinated only a small amount of very dark urine.  You have shriveled skin.  You are dizzy, confused, or both.   This information is not intended to replace advice given to you by your health care provider. Make sure you discuss any questions you have with your health care provider.   Document Released: 08/12/2005 Document Revised: 05/03/2015 Document Reviewed: 12/28/2014 Elsevier Interactive Patient Education 2016 [employer] Fall Prevention in Hospitals, Adult As a hospital patient, your condition and the treatments you receive can increase your risk for falls. Some additional risk factors for falls in a hospital include:  Being in an unfamiliar environment.  Being on bed rest.  Your surgery.  Taking certain medicines.  Your tubing requirements, such as intravenous (IV) therapy or catheters. It is important that you learn how to decrease fall risks while at the hospital. Below are important tips that can help prevent falls. SAFETY TIPS FOR PREVENTING FALLS Talk about your risk of falling.  Ask your health care provider why you are at risk for falling. Is it your medicine, illness, tubing placement, or something else?  Make a plan with your health care provider to keep you safe from falls.  Ask your health care provider or pharmacist about side effects of your medicines. Some medicines can make you dizzy or affect your coordination. Ask for help.  Ask for help before getting out of bed. You may need to press your call button.  Ask for assistance in getting safely to the toilet.  Ask for a walker or cane to be put at your bedside. Ask that most of the side  rails on your bed be placed up before your health care provider leaves the room.  Ask family or friends to sit with you.  Ask for things that are out of your reach, such as your glasses, hearing aids, telephone, bedside table, or call button. Follow these tips to avoid falling:  Stay lying or seated, rather than standing, while waiting for help.  Wear rubber-soled slippers or shoes whenever you walk in the hospital.  Avoid quick, sudden movements.  Change positions slowly.  Sit on the side of your bed before standing.  Stand up slowly and wait before you start to walk.  Let your health care provider know if there is a spill on the floor.  Pay careful attention to the medical equipment, electrical cords, and tubes around you.  When you need help, use your call button by your bed or in the bathroom. Wait for one of your health care providers to help you.  If you feel dizzy or unsure of your footing, return to bed and wait for assistance.  Avoid being distracted by the TV, telephone, or another  person in your room.  Do not lean or support yourself on rolling objects, such as IV poles or bedside tables.   This information is not intended to replace advice given to you by your health care provider. Make sure you discuss any questions you have with your health care provider.   Document Released: 08/09/2000 Document Revised: 09/02/2014 Document Reviewed: 04/19/2012 Elsevier Interactive Patient Education [employer] Elsevier Inc.

## 2016-02-21 NOTE — ED Notes (Signed)
MD at bedside. 

## 2016-02-21 NOTE — Progress Notes (Signed)
CSW attempted to speak with patient at bedside. However, patient  Was not effectively communicative. Per note, patient has a hx of dementia. Step-daughter/ Richardo PriestHolly Perryman was present who states that she is the patient's POA. Daughter confirms that [facility] and [facility]  Per [facility], [facility]. Jeanice LimHolly states that patient was found on the floor by staff at the facility. She states that she is unsure if patient really fell because he is not hurt and has no injuries. Also, she reports that pt ambulates using a walker but states his stride is not good.  Daughter states that she feels safe for patient to return to facility/[facility].  Trish MageBrittney Ebonique Hallstrom, LCSWA [phone] ED CSW 02/21/2016 1:42 PM

## 2016-02-21 NOTE — ED Notes (Addendum)
Per EMS, patient had an unwitnessed fall. Patient was found on the floor in another resident's room. Patient able to bear weight on right leg, unable to bear weight on left (which is baseline for patient) Patient also had a witnessed fall yesterday but patient was not sent out to be evaluated. Hx of dementia. [facility]

## 2016-02-21 NOTE — ED Provider Notes (Signed)
CSN: [id number]     Arrival date & time 02/21/16  [id number] History   First MD Initiated Contact with Patient 02/21/16 [phone]     Chief Complaint  Patient presents with  . Fall      Patient is a 75 y.o. male presenting with fall.  Fall  Per EMS, patient had an unwitnessed fall. Patient was found on the floor in another resident's room. Patient able to bear weight on right leg, unable to bear weight on left (which is baseline for patient) Patient also had a witnessed fall yesterday but patient was not sent out to be evaluated. Hx of dementia  Past Medical History  Diagnosis Date  . Hypertension   . Thyroid disease   . Dementia alzh  . Alzheimer disease   . Benign prostate hyperplasia   . High cholesterol    History reviewed. No pertinent past surgical history. No family history on file. Social History  Substance Use Topics  . Smoking status: Former Games developermoker  . Smokeless tobacco: None  . Alcohol Use: None    Review of Systems  Unable to perform ROS: Dementia      Allergies  Review of patient's allergies indicates no known allergies.  Home Medications   Prior to Admission medications   Medication Sig Start Date End Date Taking? Authorizing Provider  ALPRAZolam Prudy Feeler(XANAX) 1 MG tablet Take 1 mg by mouth 3 (three) times daily as needed for anxiety (anxiety).  08/11/14  Yes Historical Provider, MD  divalproex (DEPAKOTE SPRINKLE) 125 MG capsule Take 375 mg by mouth 4 (four) times daily -  with meals and at bedtime.    Yes Historical Provider, MD  docusate sodium (COLACE) 100 MG capsule Take 100 mg by mouth 2 (two) times daily.   Yes Historical Provider, MD  finasteride (PROSCAR) 5 MG tablet Take 5 mg by mouth daily.   Yes Historical Provider, MD  furosemide (LASIX) 20 MG tablet Take 10 mg by mouth daily.   Yes Historical Provider, MD  levothyroxine (SYNTHROID, LEVOTHROID) 125 MCG tablet Take 125 mcg by mouth daily before breakfast.   Yes Historical Provider, MD  lisinopril  (PRINIVIL,ZESTRIL) 5 MG tablet Take 5 mg by mouth daily.   Yes Historical Provider, MD  mirtazapine (REMERON) 15 MG tablet Take 15 mg by mouth every evening.   Yes Historical Provider, MD  OVER THE COUNTER MEDICATION Take 1 Can by mouth 3 (three) times daily. House shake   Yes Historical Provider, MD  risperiDONE (RISPERDAL) 0.5 MG tablet Take 0.5 mg by mouth 2 (two) times daily.   Yes Historical Provider, MD  tamsulosin (FLOMAX) 0.4 MG CAPS capsule Take 0.4 mg by mouth daily.   Yes Historical Provider, MD  levothyroxine (SYNTHROID) 150 MCG tablet Take 1 tablet (150 mcg total) by mouth daily before breakfast. Patient not taking: Reported on 01/13/2016 09/28/14   Richardean Canalavid H Yao, MD   BP 116/70 mmHg  Pulse 83  Temp(Src) 98.4 F (36.9 C) (Oral)  Resp 18  Ht 5\' 9"  (1.753 m)  Wt 160 lb (72.576 kg)  BMI 23.62 kg/m2  SpO2 94% Physical Exam Physical Exam  Nursing note and vitals reviewed. Constitutional: He is oriented to person, place, and time. He appears well-developed and well-nourished. No distress.  Patient easily arousable but does appear to be sleepy.   HENT:  Head: Normocephalic and atraumatic.  Eyes: Pupils are equal, round, and reactive to light.  Neck: Normal range of motion.  Cardiovascular: Normal rate and intact distal pulses.  Pulmonary/Chest: No respiratory distress.  Abdominal: Normal appearance. He exhibits no distension.  Musculoskeletal: Normal range of motion.  Neurological: He is alert and oriented to person, place, and time. No cranial nerve deficit.  Skin: Skin is warm and dry. No rash noted.    ED Course  Procedures (including critical care time) Labs Review Labs Reviewed  I-STAT CHEM 8, ED - Abnormal; Notable for the following:    BUN 30 (*)    Hemoglobin 12.9 (*)    HCT 38.0 (*)    All other components within normal limits    Imaging Review Patient had CT of head done 1 month ago after fall which was negative.  I can find no evidence of head injury on the  patient.  The daughter states she did not want a CT of the head done today.    MDM   Final diagnoses:  Dehydration        Nelva Nayobert Glorimar Stroope, MD 02/21/16 1158

## 2016-07-18 ENCOUNTER — Emergency Department (HOSPITAL_COMMUNITY)
Admission: EM | Admit: 2016-07-18 | Discharge: 2016-07-18 | Disposition: A | Discharge status: Home / Self Care | Payer: Medicare Other | Attending: Emergency Medicine | Admitting: Emergency Medicine

## 2016-07-18 ENCOUNTER — Emergency Department (HOSPITAL_COMMUNITY): Payer: Medicare Other

## 2016-07-18 ENCOUNTER — Encounter (HOSPITAL_COMMUNITY): Payer: Self-pay | Admitting: Emergency Medicine

## 2016-07-18 DIAGNOSIS — F0280 Dementia in other diseases classified elsewhere without behavioral disturbance: Secondary | ICD-10-CM | POA: Diagnosis not present

## 2016-07-18 DIAGNOSIS — Y939 Activity, unspecified: Secondary | ICD-10-CM | POA: Insufficient documentation

## 2016-07-18 DIAGNOSIS — W19XXXA Unspecified fall, initial encounter: Secondary | ICD-10-CM | POA: Insufficient documentation

## 2016-07-18 DIAGNOSIS — Z87891 Personal history of nicotine dependence: Secondary | ICD-10-CM | POA: Diagnosis not present

## 2016-07-18 DIAGNOSIS — G309 Alzheimer's disease, unspecified: Secondary | ICD-10-CM | POA: Insufficient documentation

## 2016-07-18 DIAGNOSIS — Z79899 Other long term (current) drug therapy: Secondary | ICD-10-CM | POA: Diagnosis not present

## 2016-07-18 DIAGNOSIS — I10 Essential (primary) hypertension: Secondary | ICD-10-CM | POA: Diagnosis not present

## 2016-07-18 DIAGNOSIS — Y929 Unspecified place or not applicable: Secondary | ICD-10-CM | POA: Insufficient documentation

## 2016-07-18 DIAGNOSIS — Y999 Unspecified external cause status: Secondary | ICD-10-CM | POA: Diagnosis not present

## 2016-07-18 DIAGNOSIS — R51 Headache: Secondary | ICD-10-CM | POA: Diagnosis present

## 2016-07-18 LAB — CBC WITH DIFFERENTIAL/PLATELET
Basophils Absolute: 0 10*3/uL (ref 0.0–0.1)
Basophils Relative: 0 %
EOS PCT: 5 %
Eosinophils Absolute: 0.3 10*3/uL (ref 0.0–0.7)
HCT: 36.9 % — ABNORMAL LOW (ref 39.0–52.0)
Hemoglobin: 11.8 g/dL — ABNORMAL LOW (ref 13.0–17.0)
LYMPHS ABS: 1.8 10*3/uL (ref 0.7–4.0)
LYMPHS PCT: 29 %
MCH: 30.9 pg (ref 26.0–34.0)
MCHC: 32 g/dL (ref 30.0–36.0)
MCV: 96.6 fL (ref 78.0–100.0)
MONOS PCT: 11 %
Monocytes Absolute: 0.7 10*3/uL (ref 0.1–1.0)
Neutro Abs: 3.4 10*3/uL (ref 1.7–7.7)
Neutrophils Relative %: 55 %
PLATELETS: 177 10*3/uL (ref 150–400)
RBC: 3.82 MIL/uL — ABNORMAL LOW (ref 4.22–5.81)
RDW: 14.6 % (ref 11.5–15.5)
WBC: 6.1 10*3/uL (ref 4.0–10.5)

## 2016-07-18 LAB — BASIC METABOLIC PANEL
Anion gap: 5 (ref 5–15)
BUN: 28 mg/dL — AB (ref 6–20)
CALCIUM: 8.3 mg/dL — AB (ref 8.9–10.3)
CO2: 27 mmol/L (ref 22–32)
Chloride: 110 mmol/L (ref 101–111)
Creatinine, Ser: 0.88 mg/dL (ref 0.61–1.24)
GFR calc Af Amer: >60 mL/min (ref >60)
GLUCOSE: 119 mg/dL — AB (ref 65–99)
Potassium: 4 mmol/L (ref 3.5–5.1)
Sodium: 142 mmol/L (ref 135–145)

## 2016-07-18 LAB — VALPROIC ACID LEVEL: Valproic Acid Lvl: 79 ug/mL (ref 50.0–100.0)

## 2016-07-18 NOTE — ED Triage Notes (Signed)
[facility] EMS [facility]. Patient was found on floor in room.  Patient has no complaints and no obvious injuries noted per EMS.  Patient not on any blood thinners. BP 97/51, HR 60,

## 2016-07-18 NOTE — ED Notes (Signed)
Bed: [id number] Expected date:  Expected time:  Means of arrival:  Comments: Ems 70s fall

## 2016-07-18 NOTE — ED Notes (Signed)
PTAR made aware of [facility]

## 2016-07-18 NOTE — ED Notes (Signed)
Patient transported to CT 

## 2016-07-18 NOTE — ED Provider Notes (Signed)
[facility]Y DEPT Provider Note   CSN: [id number] Arrival date & time: 07/18/16  1421     History   Chief Complaint Chief Complaint  Patient presents with  . Fall    HPI Steve Hood is a 75 y.o. male.  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 Pt reportedly found down on the ground in his room by nursing staff this morning. No obvious trauma or blood. Pt c/o mild HA at the time but currently is asymptomatic. He denies any complaints at this time. History limited 2/2 dementia.  Level 5 caveat invoked as remainder of history, ROS, and physical exam limited due to patient's severe dementia.   Past Medical History:  Diagnosis Date  . Alzheimer disease   . Benign prostate hyperplasia   . Dementia alzh  . High cholesterol   . Hypertension   . Thyroid disease     Patient Active Problem List   Diagnosis Date Noted  . 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 Aggressive behavior     History reviewed. No pertinent surgical history.     Home Medications    Prior to Admission medications   Medication Sig Start Date End Date Taking? Authorizing Provider  ALPRAZolam Prudy Feeler(XANAX) 1 MG tablet Take 1 mg by mouth 3 (three) times daily as needed for anxiety (anxiety).  08/11/14   Historical Provider, MD  divalproex (DEPAKOTE SPRINKLE) 125 MG capsule Take 375 mg by mouth 4 (four) times daily -  with meals and at bedtime.     Historical Provider, MD  docusate sodium (COLACE) 100 MG capsule Take 100 mg by mouth 2 (two) times daily.    Historical Provider, MD  finasteride (PROSCAR) 5 MG tablet Take 5 mg by mouth daily.    Historical Provider, MD  furosemide (LASIX) 20 MG tablet Take 10 mg by mouth daily.    Historical Provider, MD  levothyroxine (SYNTHROID) 150 MCG tablet Take 1 tablet (150 mcg total) by mouth daily before breakfast. Patient not taking: Reported on 01/13/2016 09/28/14   [name], MD  levothyroxine (SYNTHROID, LEVOTHROID) 125 MCG tablet Take  125 mcg by mouth daily before breakfast.    Historical Provider, MD  lisinopril (PRINIVIL,ZESTRIL) 5 MG tablet Take 5 mg by mouth daily.    Historical Provider, MD  mirtazapine (REMERON) 15 MG tablet Take 15 mg by mouth every evening.    Historical Provider, MD  OVER THE COUNTER MEDICATION Take 1 Can by mouth 3 (three) times daily. House shake    Historical Provider, MD  risperiDONE (RISPERDAL) 0.5 MG tablet Take 0.5 mg by mouth 2 (two) times daily.    Historical Provider, MD  tamsulosin (FLOMAX) 0.4 MG CAPS capsule Take 0.4 mg by mouth daily.    Historical Provider, MD    Family History No family history on file.  Social History Social History  Substance Use Topics  . Smoking status: Former Games developermoker  . Smokeless tobacco: Never Used  . Alcohol use Not on file     Allergies   Patient has no known allergies.   Review of Systems Review of Systems  Unable to perform ROS: Dementia     Physical Exam Updated Vital Signs BP 123/64 (BP Location: Left Arm)   Pulse 67   Temp 98.7 F (37.1 C) (Oral)   Resp 18   SpO2 97%   Physical Exam  Constitutional: He appears well-developed and well-nourished. No distress.  HENT:  Head: Normocephalic and atraumatic.  Eyes: Conjunctivae are  normal.  Neck: Neck supple.  No midline TTP. Full, painless ROM.  Cardiovascular: Normal rate, regular rhythm and normal heart sounds.  Exam reveals no friction rub.   No murmur heard. Pulmonary/Chest: Effort normal and breath sounds normal. No respiratory distress. He has no wheezes. He has no rales.  Abdominal: He exhibits no distension.  Musculoskeletal: He exhibits no edema.  Neurological: He is alert. He exhibits normal muscle tone.  Oriented to person only. No CN deficit. MAE with 5/5 strength. Normal sensation to light touch.  Skin: Skin is warm. Capillary refill takes less than 2 seconds.  Psychiatric: He has a normal mood and affect.  Nursing note and vitals reviewed.    ED Treatments /  Results  Labs (all labs ordered are listed, but only abnormal results are displayed) Labs Reviewed  CBC WITH DIFFERENTIAL/PLATELET - Abnormal; Notable for the following:       Result Value   RBC 3.82 (*)    Hemoglobin 11.8 (*)    HCT 36.9 (*)    All other components within normal limits  BASIC METABOLIC PANEL - Abnormal; Notable for the following:    Glucose, Bld 119 (*)    BUN 28 (*)    Calcium 8.3 (*)    All other components within normal limits  VALPROIC ACID LEVEL    EKG  EKG Interpretation None       Radiology Ct Head Wo Contrast  Result Date: 07/18/2016 CLINICAL DATA:  Unwitnessed fall EXAM: CT HEAD WITHOUT CONTRAST CT CERVICAL SPINE WITHOUT CONTRAST TECHNIQUE: Multidetector CT imaging of the head and cervical spine was performed following the standard protocol without intravenous contrast. Multiplanar CT image reconstructions of the cervical spine were also generated. COMPARISON:  01/13/2016 FINDINGS: CT HEAD FINDINGS Brain: There is no evidence for acute territorial infarction, intracranial hemorrhage, or extra-axial fluid collection. There is no focal mass, mass effect or midline shift. There is moderate cortical volume loss/ atrophy. The ventricles are slightly enlarged but similar compared to prior, likely related to atrophy. Minimal periventricular white matter hypodensity consistent with small vessel disease. Mild cerebellar atrophy. No mass or midline shift. Vascular: No hyperdense vessels.  Carotid artery calcifications. Skull: Mastoid air cells are clear. No skull fracture is visualized. Sinuses/Orbits: Mucosal thickening within the ethmoid sinuses. No acute orbital abnormality. Bilateral lens extraction. Other: Cerumen in the external auditory canals. CT CERVICAL SPINE FINDINGS Alignment: Mild straightening of the cervical spine. No subluxation. Facet alignment is maintained. Skull base and vertebrae: Craniovertebral junction appears intact. Vertebral body heights are  maintained. There is no fracture identified. Small degenerative cysts within C4 and C5. Soft tissues and spinal canal: No prevertebral fluid or swelling. No visible canal hematoma. Disc levels: Mild to moderate narrowing at C4-C5. Moderate severe narrowing at C5-C6 and C6-C7. Anterior osteophytes from C4 through C7. Mild canal stenosis from C4 through C7. Posterior disc osteophyte complex at C5-C6 and C6-C7. Multilevel bilateral facet arthropathy. Moderate multilevel bilateral foraminal stenosis. Upper chest: Lung apices clear. Atherosclerotic vascular calcification in the carotid arteries. Other: None IMPRESSION: 1. No CT evidence for acute intracranial abnormality. Mild periventricular white matter small vessel disease. Moderate atrophy. 2. Straightening of the cervical spine with multilevel degenerative disc changes. No acute fracture or malalignment. Electronically Signed   By: Jasmine PangKim  Fujinaga M.D.   On: 07/18/2016 15:17   Ct Cervical Spine Wo Contrast  Result Date: 07/18/2016 CLINICAL DATA:  Unwitnessed fall EXAM: CT HEAD WITHOUT CONTRAST CT CERVICAL SPINE WITHOUT CONTRAST TECHNIQUE: Multidetector CT imaging of  the head and cervical spine was performed following the standard protocol without intravenous contrast. Multiplanar CT image reconstructions of the cervical spine were also generated. COMPARISON:  01/13/2016 FINDINGS: CT HEAD FINDINGS Brain: There is no evidence for acute territorial infarction, intracranial hemorrhage, or extra-axial fluid collection. There is no focal mass, mass effect or midline shift. There is moderate cortical volume loss/ atrophy. The ventricles are slightly enlarged but similar compared to prior, likely related to atrophy. Minimal periventricular white matter hypodensity consistent with small vessel disease. Mild cerebellar atrophy. No mass or midline shift. Vascular: No hyperdense vessels.  Carotid artery calcifications. Skull: Mastoid air cells are clear. No skull fracture is  visualized. Sinuses/Orbits: Mucosal thickening within the ethmoid sinuses. No acute orbital abnormality. Bilateral lens extraction. Other: Cerumen in the external auditory canals. CT CERVICAL SPINE FINDINGS Alignment: Mild straightening of the cervical spine. No subluxation. Facet alignment is maintained. Skull base and vertebrae: Craniovertebral junction appears intact. Vertebral body heights are maintained. There is no fracture identified. Small degenerative cysts within C4 and C5. Soft tissues and spinal canal: No prevertebral fluid or swelling. No visible canal hematoma. Disc levels: Mild to moderate narrowing at C4-C5. Moderate severe narrowing at C5-C6 and C6-C7. Anterior osteophytes from C4 through C7. Mild canal stenosis from C4 through C7. Posterior disc osteophyte complex at C5-C6 and C6-C7. Multilevel bilateral facet arthropathy. Moderate multilevel bilateral foraminal stenosis. Upper chest: Lung apices clear. Atherosclerotic vascular calcification in the carotid arteries. Other: None IMPRESSION: 1. No CT evidence for acute intracranial abnormality. Mild periventricular white matter small vessel disease. Moderate atrophy. 2. Straightening of the cervical spine with multilevel degenerative disc changes. No acute fracture or malalignment. Electronically Signed   By: Jasmine Pang M.D.   On: 07/18/2016 15:17    Procedures Procedures (including critical care time)  Medications Ordered in ED Medications - No data to display   Initial Impression / Assessment and Plan / ED Course  I have reviewed the triage vital signs and the nursing notes.  Pertinent labs & imaging results that were available during my care of the patient were reviewed by me and considered in my medical decision making (see chart for details).  Clinical Course     75 yo M with h/o dementia, recurrent falls here with fall and mild HA. Exam is as above, pt overall very well appearing with no focal neuro exams. CT Head/C-Spine  are negative. CBC, BMP, depakote level unremarkable. No urinary sx, doubt UTI. Daughter now at bedside and admits pt is at baseline. He is smiling, well appearing, ambulatory, and without complaint. Suspect recurrent mechanical fall with no significant trauma. D/c back to facility.  Final Clinical Impressions(s) / ED Diagnoses   Final diagnoses:  Fall, initial encounter    New Prescriptions Discharge Medication List as of 07/18/2016  4:39 PM       Shaune Pollack, MD 07/19/16 [phone]

## 2016-07-27 ENCOUNTER — Encounter (HOSPITAL_COMMUNITY): Payer: Self-pay | Admitting: Emergency Medicine

## 2016-07-27 ENCOUNTER — Emergency Department (HOSPITAL_COMMUNITY)
Admission: EM | Admit: 2016-07-27 | Discharge: 2016-07-27 | Disposition: A | Discharge status: Home / Self Care | Payer: Medicare Other | Attending: Emergency Medicine | Admitting: Emergency Medicine

## 2016-07-27 DIAGNOSIS — Y939 Activity, unspecified: Secondary | ICD-10-CM | POA: Diagnosis not present

## 2016-07-27 DIAGNOSIS — Y999 Unspecified external cause status: Secondary | ICD-10-CM | POA: Insufficient documentation

## 2016-07-27 DIAGNOSIS — W19XXXA Unspecified fall, initial encounter: Secondary | ICD-10-CM | POA: Diagnosis not present

## 2016-07-27 DIAGNOSIS — I10 Essential (primary) hypertension: Secondary | ICD-10-CM | POA: Insufficient documentation

## 2016-07-27 DIAGNOSIS — Z79899 Other long term (current) drug therapy: Secondary | ICD-10-CM | POA: Insufficient documentation

## 2016-07-27 DIAGNOSIS — M549 Dorsalgia, unspecified: Secondary | ICD-10-CM | POA: Insufficient documentation

## 2016-07-27 DIAGNOSIS — G309 Alzheimer's disease, unspecified: Secondary | ICD-10-CM | POA: Insufficient documentation

## 2016-07-27 DIAGNOSIS — Z87891 Personal history of nicotine dependence: Secondary | ICD-10-CM | POA: Diagnosis not present

## 2016-07-27 DIAGNOSIS — Y929 Unspecified place or not applicable: Secondary | ICD-10-CM | POA: Diagnosis not present

## 2016-07-27 NOTE — ED Provider Notes (Signed)
[facility] DEPT Provider Note   CSN: [id number] Arrival date & time: 07/27/16  1914     History   Chief Complaint Chief Complaint  Patient presents with  . Fall    HPI Steve PortelaRichard J Hood is a 75 y.o. male.  He presents for evaluation of unwitnessed fall. Per report of his facility, [facility]. She states that the patient was on the floor complaining of back pain. There were no other noted injuries by staff at that facility. Therefore, he was sent here for evaluation. The patient is unable to give any history.  Level V caveat- dementia  HPI  Past Medical History:  Diagnosis Date  . Alzheimer disease   . Benign prostate hyperplasia   . Dementia alzh  . High cholesterol   . Hypertension   . Thyroid disease     Patient Active Problem List   Diagnosis Date Noted  . 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 Aggressive behavior     History reviewed. No pertinent surgical history.     Home Medications    Prior to Admission medications   Medication Sig Start Date End Date Taking? Authorizing Provider  ALPRAZolam Prudy Feeler(XANAX) 1 MG tablet Take 1 mg by mouth 3 (three) times daily as needed for anxiety (anxiety).  08/11/14  Yes Historical Provider, MD  citalopram (CELEXA) 20 MG tablet Take 20 mg by mouth daily.   Yes Historical Provider, MD  divalproex (DEPAKOTE SPRINKLE) 125 MG capsule Take 375 mg by mouth 4 (four) times daily -  with meals and at bedtime.    Yes Historical Provider, MD  docusate sodium (COLACE) 100 MG capsule Take 100 mg by mouth 2 (two) times daily.   Yes Historical Provider, MD  erythromycin ophthalmic ointment Place 1 application into the right eye 3 (three) times daily.   Yes Historical Provider, MD  finasteride (PROSCAR) 5 MG tablet Take 5 mg by mouth daily.   Yes Historical Provider, MD  furosemide (LASIX) 20 MG tablet Take 10 mg by mouth daily.   Yes Historical Provider, MD  levothyroxine  (SYNTHROID, LEVOTHROID) 125 MCG tablet Take 125 mcg by mouth daily before breakfast.   Yes Historical Provider, MD  lisinopril (PRINIVIL,ZESTRIL) 5 MG tablet Take 5 mg by mouth daily.   Yes Historical Provider, MD  mirtazapine (REMERON) 15 MG tablet Take 15 mg by mouth every evening.   Yes Historical Provider, MD  OVER THE COUNTER MEDICATION Take 1 Can by mouth 3 (three) times daily. House shake   Yes Historical Provider, MD  QUEtiapine (SEROQUEL) 25 MG tablet Take 25 mg by mouth 2 (two) times daily.   Yes Historical Provider, MD  tamsulosin (FLOMAX) 0.4 MG CAPS capsule Take 0.4 mg by mouth daily.   Yes Historical Provider, MD  levothyroxine (SYNTHROID) 150 MCG tablet Take 1 tablet (150 mcg total) by mouth daily before breakfast. Patient not taking: Reported on 01/13/2016 09/28/14   Steve Panderavid Hsienta Yao, MD    Family History History reviewed. No pertinent family history.  Social History Social History  Substance Use Topics  . Smoking status: Former Games developermoker  . Smokeless tobacco: Never Used  . Alcohol use Not on file     Allergies   Patient has no known allergies.   Review of Systems Review of Systems  Unable to perform ROS: Dementia     Physical Exam Updated Vital Signs BP 114/67 (BP Location: Left Arm)   Pulse 60  Temp 98.3 F (36.8 C) (Oral)   Resp 20   SpO2 96%   Physical Exam  Constitutional: He appears well-developed.  Elderly, frail  HENT:  Head: Normocephalic and atraumatic.  Right Ear: External ear normal.  Left Ear: External ear normal.  No visible or palpable injury of the scalp or cranium.  Eyes: Conjunctivae and EOM are normal. Pupils are equal, round, and reactive to light.  Neck: Normal range of motion and phonation normal. Neck supple.  Cardiovascular: Normal rate, regular rhythm and normal heart sounds.   Pulmonary/Chest: Effort normal and breath sounds normal. He exhibits no bony tenderness.  Abdominal: Soft. There is no tenderness.  Musculoskeletal:  Normal range of motion.  No deformity of the arms or legs.  Neurological: He is alert. No cranial nerve deficit. Coordination normal.  Skin: Skin is warm, dry and intact.  Psychiatric: His behavior is normal.  Nursing note and vitals reviewed.    ED Treatments / Results  Labs (all labs ordered are listed, but only abnormal results are displayed) Labs Reviewed - No data to display  EKG  EKG Interpretation None       Radiology No results found.  Procedures Procedures (including critical care time)  Medications Ordered in ED Medications - No data to display   Initial Impression / Assessment and Plan / ED Course  I have reviewed the triage vital signs and the nursing notes.  Pertinent labs & imaging results that were available during my care of the patient were reviewed by me and considered in my medical decision making (see chart for details).  Clinical Course     Medications - No data to display  Patient Vitals for the past 24 hrs:  BP Temp Temp src Pulse Resp SpO2  07/27/16 1931 114/67 98.3 F (36.8 C) Oral 60 20 96 %    8:50 PM Reevaluation with update and discussion. After initial assessment and treatment, an updated evaluation reveals No change in clinical status. Findings discussed with staff at his facility, all questions answered. Delno Blaisdell L    Final Clinical Impressions(s) / ED Diagnoses   Final diagnoses:  Fall, initial encounter   Reported fall, patient found on  Floor without visible injuries. No indication for additional evaluation. Similar recent fall about one week ago, evaluated here. Vital signs are normal. Doubt serious. Spectral infection or metabolic instability.  Nursing Notes Reviewed/ Care Coordinated Applicable Imaging Reviewed Interpretation of Laboratory Data incorporated into ED treatment  The patient appears reasonably screened and/or stabilized for discharge and I doubt any other medical condition or other [facility] requiring  further screening, evaluation, or treatment in the ED at this time prior to discharge.  Plan: Home Medications- continue; Home Treatments- rest; return here if the recommended treatment, does not improve the symptoms; Recommended follow up- PCP prn   New Prescriptions New Prescriptions   No medications on file     Mancel BaleElliott Dhanya Bogle, MD 07/27/16 2053

## 2016-07-27 NOTE — ED Notes (Signed)
Bed: WA22 Expected date:  Expected time:  Means of arrival:  Comments: EMS-fall 

## 2016-07-27 NOTE — ED Triage Notes (Signed)
Pt presents to ED via EMS to be evaluated for unwitnessed fall. Pt found on floor. Pt denies any pain and no obvious injuries noted. [facility] Hx of dementia.

## 2016-07-27 NOTE — ED Notes (Signed)
Patient moved to nurses station in full view due to dementia and high risk fall

## 2016-07-27 NOTE — Discharge Instructions (Signed)
There were no evident injuries found after he was found on the floor today.  Use Tylenol if needed for pain.  Call his PCP if needed, for problems.

## 2016-08-04 ENCOUNTER — Encounter (HOSPITAL_COMMUNITY): Payer: Self-pay | Admitting: Emergency Medicine

## 2016-08-04 ENCOUNTER — Emergency Department (HOSPITAL_COMMUNITY)
Admission: EM | Admit: 2016-08-04 | Discharge: 2016-08-04 | Disposition: A | Discharge status: Home / Self Care | Payer: Medicare Other | Attending: Emergency Medicine | Admitting: Emergency Medicine

## 2016-08-04 DIAGNOSIS — G309 Alzheimer's disease, unspecified: Secondary | ICD-10-CM | POA: Diagnosis not present

## 2016-08-04 DIAGNOSIS — Z87891 Personal history of nicotine dependence: Secondary | ICD-10-CM | POA: Insufficient documentation

## 2016-08-04 DIAGNOSIS — Y929 Unspecified place or not applicable: Secondary | ICD-10-CM | POA: Insufficient documentation

## 2016-08-04 DIAGNOSIS — I10 Essential (primary) hypertension: Secondary | ICD-10-CM | POA: Insufficient documentation

## 2016-08-04 DIAGNOSIS — Z043 Encounter for examination and observation following other accident: Secondary | ICD-10-CM | POA: Diagnosis not present

## 2016-08-04 DIAGNOSIS — Y999 Unspecified external cause status: Secondary | ICD-10-CM | POA: Insufficient documentation

## 2016-08-04 DIAGNOSIS — Y939 Activity, unspecified: Secondary | ICD-10-CM | POA: Diagnosis not present

## 2016-08-04 DIAGNOSIS — W19XXXA Unspecified fall, initial encounter: Secondary | ICD-10-CM | POA: Diagnosis not present

## 2016-08-04 NOTE — ED Provider Notes (Signed)
[facility] Provider Note   CSN: [id number] Arrival date & time: 08/04/16  1059     History   Chief Complaint Chief Complaint  Patient presents with  . Fall    HPI Steve Hood is a 75 y.o. male with history of dementia presenting for [facility]. Patient does not remember falling and denies any pain. Patient not able to give me history. No one else at bedside.    The history is provided by medical records and the patient. The history is limited by a developmental delay.    Past Medical History:  Diagnosis Date  . Alzheimer disease   . Benign prostate hyperplasia   . Dementia alzh  . High cholesterol   . Hypertension   . Thyroid disease     Patient Active Problem List   Diagnosis Date Noted  . 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 Aggressive behavior     History reviewed. No pertinent surgical history.     Home Medications    Prior to Admission medications   Medication Sig Start Date End Date Taking? Authorizing Provider  ALPRAZolam Prudy Feeler(XANAX) 1 MG tablet Take 1 mg by mouth 3 (three) times daily as needed for anxiety (anxiety).  08/11/14   Historical Provider, MD  citalopram (CELEXA) 20 MG tablet Take 20 mg by mouth daily.    Historical Provider, MD  divalproex (DEPAKOTE SPRINKLE) 125 MG capsule Take 375 mg by mouth 4 (four) times daily -  with meals and at bedtime.     Historical Provider, MD  docusate sodium (COLACE) 100 MG capsule Take 100 mg by mouth 2 (two) times daily.    Historical Provider, MD  erythromycin ophthalmic ointment Place 1 application into the right eye 3 (three) times daily.    Historical Provider, MD  finasteride (PROSCAR) 5 MG tablet Take 5 mg by mouth daily.    Historical Provider, MD  furosemide (LASIX) 20 MG tablet Take 10 mg by mouth daily.    Historical Provider, MD  levothyroxine (SYNTHROID) 150 MCG tablet Take 1 tablet (150 mcg total) by mouth daily before  breakfast. Patient not taking: Reported on 01/13/2016 09/28/14   [name], MD  levothyroxine (SYNTHROID, LEVOTHROID) 125 MCG tablet Take 125 mcg by mouth daily before breakfast.    Historical Provider, MD  lisinopril (PRINIVIL,ZESTRIL) 5 MG tablet Take 5 mg by mouth daily.    Historical Provider, MD  mirtazapine (REMERON) 15 MG tablet Take 15 mg by mouth every evening.    Historical Provider, MD  OVER THE COUNTER MEDICATION Take 1 Can by mouth 3 (three) times daily. House shake    Historical Provider, MD  QUEtiapine (SEROQUEL) 25 MG tablet Take 25 mg by mouth 2 (two) times daily.    Historical Provider, MD  tamsulosin (FLOMAX) 0.4 MG CAPS capsule Take 0.4 mg by mouth daily.    Historical Provider, MD    Family History History reviewed. No pertinent family history.  Social History Social History  Substance Use Topics  . Smoking status: Former Games developermoker  . Smokeless tobacco: Never Used  . Alcohol use Not on file     Allergies   Patient has no known allergies.   Review of Systems Review of Systems  Unable to perform ROS: Dementia     Physical Exam Updated Vital Signs BP 142/82 (BP Location: Right Arm)   Pulse 76   Temp 98 F (36.7 C) (Oral)   Resp 18  SpO2 99%   Physical Exam  Constitutional: He appears well-developed and well-nourished.  HENT:  Head: Normocephalic and atraumatic.  Nose: Nose normal.  No visible or palpable injury to head, scalp or neck.   Eyes: Conjunctivae and EOM are normal. Pupils are equal, round, and reactive to light.  Neck: Normal range of motion. Neck supple.  Cardiovascular: Normal rate and normal heart sounds.   Pulmonary/Chest: Effort normal and breath sounds normal. No respiratory distress. He exhibits no tenderness.  Abdominal: Soft. Bowel sounds are normal. There is no tenderness. There is no rebound and no guarding.  Musculoskeletal: Normal range of motion. He exhibits no tenderness.  No visible or palpable injury to spine,  paraspinal muscles, pelvis, or extremities.   Neurological: He is alert. He has normal strength.  Able to ambulate slowly.  Skin: Skin is warm. Capillary refill takes less than 2 seconds.  Psychiatric:  History of Dementia  Nursing note and vitals reviewed.    ED Treatments / Results  Labs (all labs ordered are listed, but only abnormal results are displayed) Labs Reviewed - No data to display  EKG  EKG Interpretation None       Radiology No results found.  Procedures Procedures (including critical care time)  Medications Ordered in ED Medications - No data to display   Initial Impression / Assessment and Plan / ED Course  I have reviewed the triage vital signs and the nursing notes.  Pertinent labs & imaging results that were available during my care of the patient were reviewed by me and considered in my medical decision making (see chart for details).  Clinical Course   Patient is a 75 year old male with history of dementia presenting for unwitnessed fall. Similar recent falls 1 week ago and 2 weeks ago, both evaluated here. History unable to obtain due to history of dementia. No one else at bedside. On exam patient is alert and happy. NAD, VSS, afebrile. Heart and lung sounds are clear. No visual or palpable evidence of injury to head, scalp, neck, spine, pelvis, or extremities. Patient able to stand and ambulate slowly. There is no indication for head CT or cervical CT at this time and presentation. Patient continues to be NAD, hemodynamically stable, and afebrile. I feel safe to discharge at this time back to his assisted living facility.   Final Clinical Impressions(s) / ED Diagnoses   Final diagnoses:  Fall, initial encounter    New Prescriptions Discharge Medication List as of 08/04/2016 11:54 AM       [address] [place], [place] 08/04/16 2113    Lyndal Pulleyaniel Knott, MD 08/05/16 1600

## 2016-08-04 NOTE — ED Triage Notes (Signed)
[facility] of fall today. Denies LOC. No pain. Hx of dementia.

## 2016-08-04 NOTE — Discharge Instructions (Signed)
What are some other fall prevention tips? Wear closed-toe shoes that fit well and support your feet. Wear shoes that have rubber soles or low heels. When you use a stepladder, make sure that it is completely opened and that the sides are firmly locked. Have someone hold the ladder while you are using it. Do not climb a closed stepladder. Add color or contrast paint or tape to grab bars and handrails in your home. Place contrasting color strips on the first and last steps. Use mobility aids as needed, such as canes, walkers, scooters, and crutches. Turn on lights if it is dark. Replace any light bulbs that burn out. Set up furniture so that there are clear paths. Keep the furniture in the same spot. Fix any uneven floor surfaces. Choose a carpet design that does not hide the edge of steps of a stairway. Be aware of any and all pets. Review your medicines with your healthcare provider. Some medicines can cause dizziness or changes in blood pressure, which increase your risk of falling.

## 2016-08-16 ENCOUNTER — Emergency Department (HOSPITAL_COMMUNITY): Payer: Medicare Other

## 2016-08-16 ENCOUNTER — Emergency Department (HOSPITAL_COMMUNITY)
Admission: EM | Admit: 2016-08-16 | Discharge: 2016-08-16 | Disposition: A | Discharge status: Home / Self Care | Payer: Medicare Other | Attending: Emergency Medicine | Admitting: Emergency Medicine

## 2016-08-16 ENCOUNTER — Encounter (HOSPITAL_COMMUNITY): Payer: Self-pay | Admitting: Emergency Medicine

## 2016-08-16 DIAGNOSIS — Y939 Activity, unspecified: Secondary | ICD-10-CM | POA: Insufficient documentation

## 2016-08-16 DIAGNOSIS — G309 Alzheimer's disease, unspecified: Secondary | ICD-10-CM | POA: Insufficient documentation

## 2016-08-16 DIAGNOSIS — I10 Essential (primary) hypertension: Secondary | ICD-10-CM | POA: Insufficient documentation

## 2016-08-16 DIAGNOSIS — Y92129 Unspecified place in nursing home as the place of occurrence of the external cause: Secondary | ICD-10-CM | POA: Insufficient documentation

## 2016-08-16 DIAGNOSIS — F0280 Dementia in other diseases classified elsewhere without behavioral disturbance: Secondary | ICD-10-CM | POA: Diagnosis not present

## 2016-08-16 DIAGNOSIS — Y999 Unspecified external cause status: Secondary | ICD-10-CM | POA: Insufficient documentation

## 2016-08-16 DIAGNOSIS — Z79899 Other long term (current) drug therapy: Secondary | ICD-10-CM | POA: Diagnosis not present

## 2016-08-16 DIAGNOSIS — W19XXXA Unspecified fall, initial encounter: Secondary | ICD-10-CM | POA: Insufficient documentation

## 2016-08-16 DIAGNOSIS — M545 Low back pain: Secondary | ICD-10-CM | POA: Insufficient documentation

## 2016-08-16 DIAGNOSIS — Z87891 Personal history of nicotine dependence: Secondary | ICD-10-CM | POA: Insufficient documentation

## 2016-08-16 LAB — BASIC METABOLIC PANEL
Anion gap: 7 (ref 5–15)
BUN: 18 mg/dL (ref 6–20)
CO2: 25 mmol/L (ref 22–32)
CREATININE: 0.78 mg/dL (ref 0.61–1.24)
Calcium: 8.3 mg/dL — ABNORMAL LOW (ref 8.9–10.3)
Chloride: 108 mmol/L (ref 101–111)
GFR calc Af Amer: >60 mL/min (ref >60)
GLUCOSE: 94 mg/dL (ref 65–99)
POTASSIUM: 3.9 mmol/L (ref 3.5–5.1)
SODIUM: 140 mmol/L (ref 135–145)

## 2016-08-16 LAB — CBC
HCT: 37.8 % — ABNORMAL LOW (ref 39.0–52.0)
Hemoglobin: 12.9 g/dL — ABNORMAL LOW (ref 13.0–17.0)
MCH: 31.5 pg (ref 26.0–34.0)
MCHC: 34.1 g/dL (ref 30.0–36.0)
MCV: 92.2 fL (ref 78.0–100.0)
PLATELETS: 131 10*3/uL — AB (ref 150–400)
RBC: 4.1 MIL/uL — AB (ref 4.22–5.81)
RDW: 14.1 % (ref 11.5–15.5)
WBC: 6.5 10*3/uL (ref 4.0–10.5)

## 2016-08-16 NOTE — ED Triage Notes (Addendum)
[facility] related to unwitnessed fall. Pt has c collar and KED in place. Pt hx of dementia; alert to self only per normal.

## 2016-08-16 NOTE — Discharge Instructions (Signed)
Continue your current medications, follow up with your doctor as needed

## 2016-08-16 NOTE — ED Notes (Signed)
Bed: [facility] Expected date:  Expected time:  Means of arrival:  Comments: 

## 2016-08-16 NOTE — ED Notes (Signed)
Bed: [id number] Expected date: 08/16/16 Expected time: 8:05 AM Means of arrival: Ambulance Comments: Fall from N[facility] home

## 2016-08-16 NOTE — ED Provider Notes (Signed)
[facility] Provider Note   CSN: [id number] Arrival date & time: 08/16/16  [id number]     History   Chief Complaint Chief Complaint  Patient presents with  . Fall    HPI Steve Hood is a 75 y.o. male.  HPI Patient presents to the emergency room for a probable unwitnessed fall at his nursing facility. Patient has a history of dementia. He has history of previous falls.  According to the EMS report the patient had a presumed fall at the nursing home. EMS was called to bring the patient to the emergency room for evaluation. At some point he complained of lower back pain. Patient right now denies any pain and does not know what happened.  When I first evaluated the patient, he was standing up at the bedside half on the bed with his legs between the bed railing.  [facility] and the towel around his neck Past Medical History:  Diagnosis Date  . Alzheimer disease   . Benign prostate hyperplasia   . Dementia alzh  . High cholesterol   . Hypertension   . Thyroid disease     Patient Active Problem List   Diagnosis Date Noted  . 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 Aggressive behavior     History reviewed. No pertinent surgical history.     Home Medications    Prior to Admission medications   Medication Sig Start Date End Date Taking? Authorizing Provider  ALPRAZolam Prudy Feeler(XANAX) 1 MG tablet Take 1 mg by mouth 3 (three) times daily as needed for anxiety (anxiety).  08/11/14   Historical Provider, MD  citalopram (CELEXA) 20 MG tablet Take 20 mg by mouth daily.    Historical Provider, MD  divalproex (DEPAKOTE SPRINKLE) 125 MG capsule Take 375 mg by mouth 4 (four) times daily -  with meals and at bedtime.     Historical Provider, MD  docusate sodium (COLACE) 100 MG capsule Take 100 mg by mouth 2 (two) times daily.    Historical Provider, MD  erythromycin ophthalmic ointment Place 1 application into the right eye 3 (three) times daily.     Historical Provider, MD  finasteride (PROSCAR) 5 MG tablet Take 5 mg by mouth daily.    Historical Provider, MD  furosemide (LASIX) 20 MG tablet Take 10 mg by mouth daily.    Historical Provider, MD  levothyroxine (SYNTHROID) 150 MCG tablet Take 1 tablet (150 mcg total) by mouth daily before breakfast. Patient not taking: Reported on 01/13/2016 09/28/14   [name], MD  levothyroxine (SYNTHROID, LEVOTHROID) 125 MCG tablet Take 125 mcg by mouth daily before breakfast.    Historical Provider, MD  lisinopril (PRINIVIL,ZESTRIL) 5 MG tablet Take 5 mg by mouth daily.    Historical Provider, MD  mirtazapine (REMERON) 15 MG tablet Take 15 mg by mouth every evening.    Historical Provider, MD  OVER THE COUNTER MEDICATION Take 1 Can by mouth 3 (three) times daily. House shake    Historical Provider, MD  QUEtiapine (SEROQUEL) 25 MG tablet Take 25 mg by mouth 2 (two) times daily.    Historical Provider, MD  tamsulosin (FLOMAX) 0.4 MG CAPS capsule Take 0.4 mg by mouth daily.    Historical Provider, MD    Family History No family history on file.  Social History Social History  Substance Use Topics  . Smoking status: Former Games developermoker  . Smokeless tobacco: Never Used  . Alcohol use Not on file  Allergies   Patient has no known allergies.   Review of Systems Review of Systems  All other systems reviewed and are negative.    Physical Exam Updated Vital Signs BP 114/69   Pulse 78   Temp 97.8 F (36.6 C) (Oral)   Resp 15   SpO2 98%   Physical Exam  Constitutional: No distress.  HENT:  Head: Normocephalic and atraumatic.  Right Ear: External ear normal.  Left Ear: External ear normal.  Eyes: Conjunctivae are normal. Right eye exhibits no discharge. Left eye exhibits no discharge. No scleral icterus.  Neck: Neck supple. No tracheal deviation present.  Cardiovascular: Normal rate, regular rhythm and intact distal pulses.   Pulmonary/Chest: Effort normal and breath sounds normal.  No stridor. No respiratory distress. He has no wheezes. He has no rales.  Abdominal: Soft. Bowel sounds are normal. He exhibits no distension. There is no tenderness. There is no rebound and no guarding.  Musculoskeletal: He exhibits no edema or tenderness.       Right hip: Normal. He exhibits no tenderness.       Left hip: Normal. He exhibits no tenderness.       Cervical back: Normal. He exhibits no tenderness.       Thoracic back: Normal. He exhibits no tenderness.       Lumbar back: Normal. He exhibits no tenderness.  Neurological: He is alert. He has normal strength. No sensory deficit. Cranial nerve deficit: no gross deficits. He exhibits normal muscle tone. He displays no seizure activity. Coordination normal. GCS eye subscore is 4. GCS verbal subscore is 4. GCS motor subscore is 6.  Skin: Skin is warm and dry. No rash noted. He is not diaphoretic.  Psychiatric: He has a normal mood and affect.  Nursing note and vitals reviewed.    ED Treatments / Results  Labs (all labs ordered are listed, but only abnormal results are displayed) Labs Reviewed  CBC - Abnormal; Notable for the following:       Result Value   RBC 4.10 (*)    Hemoglobin 12.9 (*)    HCT 37.8 (*)    Platelets 131 (*)    All other components within normal limits  BASIC METABOLIC PANEL - Abnormal; Notable for the following:    Calcium 8.3 (*)    All other components within normal limits    EKG  EKG Interpretation  Date/Time:  Friday August 16 2016 08:27:19 EST Ventricular Rate:  78 PR Interval:    QRS Duration: 89 QT Interval:  386 QTC Calculation: 440 R Axis:   49 Text Interpretation:  Sinus rhythm Borderline repolarization abnormality Baseline wander in lead(s) V2 V5 Otherwise no significant change Confirmed by Stephens Shreve  MD-J, Jona Zappone ([id number]) on 08/16/2016 9:19:34 AM       Radiology Dg Lumbar Spine Complete  Result Date: 08/16/2016 CLINICAL DATA:  Fall. EXAM: LUMBAR SPINE - COMPLETE 4+ VIEW COMPARISON:   No recent prior . FINDINGS: Lumbar vertebra numbered with the lowest segmented vertebral body as L5. Diffuse osteopenia and degenerative change. L4-L5 3 mm anterolisthesis. Aortoiliac atherosclerotic vascular calcification . IMPRESSION: 1. Diffuse osteopenia multilevel degenerative change. 3 mm anterolisthesis L4-L5. No acute bony abnormality. 2. Aortoiliac atherosclerotic vascular disease . Electronically Signed   By: Maisie Fus  Register   On: 08/16/2016 09:53    Procedures Procedures (including critical care time)  Medications Ordered in ED Medications - No data to display   Initial Impression / Assessment and Plan / ED Course  I have  reviewed the triage vital signs and the nursing notes.  Pertinent labs & imaging results that were available during my care of the patient were reviewed by me and considered in my medical decision making (see chart for details).  Clinical Course    Pt denies any complaints.  No signs of injury on exam.  Labs, xray , EKG reassuring.  Suspect mechanical fall.  Pt had this before. At this time there does not appear to be any evidence of an acute emergency medical condition and the patient appears stable for discharge with appropriate outpatient follow up.   Final Clinical Impressions(s) / ED Diagnoses   Final diagnoses:  Fall, initial encounter    New Prescriptions New Prescriptions   No medications on file     Linwood DibblesJon Kaelyn Nauta, MD 08/16/16 1007

## 2016-09-02 ENCOUNTER — Encounter (HOSPITAL_BASED_OUTPATIENT_CLINIC_OR_DEPARTMENT_OTHER): Payer: Self-pay | Admitting: *Deleted

## 2016-09-02 ENCOUNTER — Emergency Department (HOSPITAL_BASED_OUTPATIENT_CLINIC_OR_DEPARTMENT_OTHER)
Admission: EM | Admit: 2016-09-02 | Discharge: 2016-09-02 | Disposition: A | Discharge status: Home / Self Care | Payer: Medicare Other | Attending: Emergency Medicine | Admitting: Emergency Medicine

## 2016-09-02 DIAGNOSIS — Y929 Unspecified place or not applicable: Secondary | ICD-10-CM | POA: Diagnosis not present

## 2016-09-02 DIAGNOSIS — F0281 Dementia in other diseases classified elsewhere with behavioral disturbance: Secondary | ICD-10-CM | POA: Diagnosis not present

## 2016-09-02 DIAGNOSIS — I10 Essential (primary) hypertension: Secondary | ICD-10-CM | POA: Insufficient documentation

## 2016-09-02 DIAGNOSIS — Z043 Encounter for examination and observation following other accident: Secondary | ICD-10-CM | POA: Diagnosis present

## 2016-09-02 DIAGNOSIS — G308 Other Alzheimer's disease: Secondary | ICD-10-CM | POA: Diagnosis not present

## 2016-09-02 DIAGNOSIS — Y939 Activity, unspecified: Secondary | ICD-10-CM | POA: Insufficient documentation

## 2016-09-02 DIAGNOSIS — W19XXXA Unspecified fall, initial encounter: Secondary | ICD-10-CM | POA: Diagnosis not present

## 2016-09-02 DIAGNOSIS — Y999 Unspecified external cause status: Secondary | ICD-10-CM | POA: Insufficient documentation

## 2016-09-02 DIAGNOSIS — T148XXA Other injury of unspecified body region, initial encounter: Secondary | ICD-10-CM | POA: Insufficient documentation

## 2016-09-02 DIAGNOSIS — Z87891 Personal history of nicotine dependence: Secondary | ICD-10-CM | POA: Diagnosis not present

## 2016-09-02 DIAGNOSIS — Z79899 Other long term (current) drug therapy: Secondary | ICD-10-CM | POA: Insufficient documentation

## 2016-09-02 NOTE — ED Notes (Signed)
Daughter leaving , pt waiting for [facility]

## 2016-09-02 NOTE — ED Provider Notes (Signed)
[facility] Provider Note   CSN: [id number] Arrival date & time: 09/02/16  1604     History   Chief Complaint Chief Complaint  Patient presents with  . Fall    HPI Steve Hood is a 76 y.o. male.  HPI level V caveat dementia. History obtained from patient's daughter and fromShirlea Julien Girt at [facility] fire telephone. Patient fell this afternoon approximately 3 PM unwitnessed fall. Blood pressure was noted to be "low."" However blood pressure was not recorded. Patient is presently alert and denies pain and denies discomfort anywhere. He was started on on Klonopin for agitation and took his first dose today.  Past Medical History:  Diagnosis Date  . Alzheimer disease   . Benign prostate hyperplasia   . Dementia alzh  . High cholesterol   . Hypertension   . Thyroid disease     Patient Active Problem List   Diagnosis Date Noted  . 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 Aggressive behavior     History reviewed. No pertinent surgical history.     Home Medications    Prior to Admission medications   Medication Sig Start Date End Date Taking? Authorizing Provider  clonazePAM (KLONOPIN) 0.5 MG tablet Take 0.25 mg by mouth daily.   Yes Historical Provider, MD  risperiDONE (RISPERDAL) 0.5 MG tablet Take 0.5 mg by mouth at bedtime.   Yes Historical Provider, MD  ALPRAZolam Prudy Feeler) 1 MG tablet Take 1 mg by mouth 3 (three) times daily as needed for anxiety (anxiety).  08/11/14   Historical Provider, MD  citalopram (CELEXA) 20 MG tablet Take 20 mg by mouth daily.    Historical Provider, MD  divalproex (DEPAKOTE SPRINKLE) 125 MG capsule Take 375 mg by mouth 4 (four) times daily -  with meals and at bedtime.     Historical Provider, MD  docusate sodium (COLACE) 100 MG capsule Take 100 mg by mouth 2 (two) times daily.    Historical Provider, MD  erythromycin ophthalmic ointment Place 1 application into the right eye 3 (three) times daily.     Historical Provider, MD  finasteride (PROSCAR) 5 MG tablet Take 5 mg by mouth daily.    Historical Provider, MD  furosemide (LASIX) 20 MG tablet Take 10 mg by mouth daily.    Historical Provider, MD  levothyroxine (SYNTHROID, LEVOTHROID) 125 MCG tablet Take 125 mcg by mouth daily before breakfast.    Historical Provider, MD  lisinopril (PRINIVIL,ZESTRIL) 5 MG tablet Take 5 mg by mouth daily.    Historical Provider, MD  mirtazapine (REMERON) 15 MG tablet Take 15 mg by mouth every evening.    Historical Provider, MD  OVER THE COUNTER MEDICATION Take 1 Can by mouth 3 (three) times daily. House shake    Historical Provider, MD  QUEtiapine (SEROQUEL) 25 MG tablet Take 25 mg by mouth 2 (two) times daily.    Historical Provider, MD  tamsulosin (FLOMAX) 0.4 MG CAPS capsule Take 0.4 mg by mouth daily.    Historical Provider, MD    Family History History reviewed. No pertinent family history.  Social History Social History  Substance Use Topics  . Smoking status: Former Games developer  . Smokeless tobacco: Never Used  . Alcohol use No     Allergies   Patient has no known allergies.   Review of Systems Review of Systems  Unable to perform ROS: Dementia     Physical Exam Updated Vital Signs BP (!) 106/52 (BP Location: Right Arm)   Pulse 63  Temp 98.1 F (36.7 C) (Oral)   Resp 16   SpO2 96%   Physical Exam  Constitutional: He appears well-developed and well-nourished. No distress.  No facial asymmetry  HENT:  Head: Normocephalic and atraumatic.  Eyes: Conjunctivae are normal. Pupils are equal, round, and reactive to light.  Neck: Neck supple. No tracheal deviation present. No thyromegaly present.  Cardiovascular: Normal rate and regular rhythm.   No murmur heard. Pulmonary/Chest: Effort normal and breath sounds normal.  Abdominal: Soft. Bowel sounds are normal. He exhibits no distension. There is no tenderness.  Musculoskeletal: Normal range of motion. He exhibits no edema or  tenderness.  Entire spine nontender. Pelvis stable nontender. All 4 extremity is a contusion abrasion or tenderness neurovascularly intact.  Neurological: He is alert. Coordination normal.  Moves all extremities.  Skin: Skin is warm and dry. No rash noted.  Psychiatric: He has a normal mood and affect.  Nursing note and vitals reviewed.    ED Treatments / Results  Labs (all labs ordered are listed, but only abnormal results are displayed) Labs Reviewed - No data to display  EKG  EKG Interpretation None       Radiology No results found.  Procedures Procedures (including critical care time)  Medications Ordered in ED Medications - No data to display   Initial Impression / Assessment and Plan / ED Course  I have reviewed the triage vital signs and the nursing notes.  Pertinent labs & imaging results that were available during my care of the patient were reviewed by me and considered in my medical decision making (see chart for details).  Clinical Course     No injury detected. Plan return to this memory care unit([facility])  Final Clinical Impressions(s) / ED Diagnoses  Diagnosis fall Final diagnoses:  None    New Prescriptions New Prescriptions   No medications on file     Doug SouSam Dijon Cosens, MD 09/02/16 1944

## 2016-09-02 NOTE — Discharge Instructions (Signed)
No injuries detected. Return if concern for any reason or call Mr.Molinaro's physician.

## 2016-09-02 NOTE — ED Notes (Signed)
PTAr here for tranpsort

## 2016-09-02 NOTE — ED Notes (Signed)
Meal given to pt and daughter is feeding him

## 2016-09-02 NOTE — ED Notes (Signed)
Per Dr[facility] Ethelda ChickJacubowitz, [facility] [phone]

## 2016-09-02 NOTE — ED Notes (Signed)
Family at bedside. 

## 2016-09-02 NOTE — ED Notes (Signed)
PT[facility] called again for transport

## 2016-09-02 NOTE — ED Triage Notes (Addendum)
[facility] w/o [facility], change in meds started on Risperdal and klonopin today, hypotension

## 2016-09-02 NOTE — ED Notes (Signed)
ED Provider at bedside. 

## 2016-09-02 NOTE — ED Notes (Signed)
[facility] called for transport.  

## 2016-09-14 ENCOUNTER — Encounter (HOSPITAL_COMMUNITY): Payer: Self-pay

## 2016-09-14 ENCOUNTER — Emergency Department (HOSPITAL_COMMUNITY)
Admission: EM | Admit: 2016-09-14 | Discharge: 2016-09-14 | Disposition: A | Discharge status: Home / Self Care | Payer: Medicare Other | Attending: Emergency Medicine | Admitting: Emergency Medicine

## 2016-09-14 DIAGNOSIS — Y939 Activity, unspecified: Secondary | ICD-10-CM | POA: Diagnosis not present

## 2016-09-14 DIAGNOSIS — W19XXXA Unspecified fall, initial encounter: Secondary | ICD-10-CM | POA: Insufficient documentation

## 2016-09-14 DIAGNOSIS — Z87891 Personal history of nicotine dependence: Secondary | ICD-10-CM | POA: Insufficient documentation

## 2016-09-14 DIAGNOSIS — F0390 Unspecified dementia without behavioral disturbance: Secondary | ICD-10-CM | POA: Insufficient documentation

## 2016-09-14 DIAGNOSIS — I10 Essential (primary) hypertension: Secondary | ICD-10-CM | POA: Insufficient documentation

## 2016-09-14 DIAGNOSIS — Y999 Unspecified external cause status: Secondary | ICD-10-CM | POA: Insufficient documentation

## 2016-09-14 DIAGNOSIS — Y929 Unspecified place or not applicable: Secondary | ICD-10-CM | POA: Insufficient documentation

## 2016-09-14 NOTE — ED Provider Notes (Signed)
[facility] Provider Note   CSN: [id number] Arrival date & time: 09/14/16  1355     History   Chief Complaint Chief Complaint  Patient presents with  . Fall    HPI Steve Hood is a 76 y.o. male.  HPI Patient has significant dementia. He is sent from his nursing facility with an unwitnessed fall and was reportedly on the floor for 5 minutes duration. No evident injuries. He is sent for evaluation. Due to dementia patient is a poor historian but he is not endorsing or reporting any localizing pain. Past Medical History:  Diagnosis Date  . Alzheimer disease   . Benign prostate hyperplasia   . Dementia alzh  . High cholesterol   . Hypertension   . Thyroid disease     Patient Active Problem List   Diagnosis Date Noted  . 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 Aggressive behavior     History reviewed. No pertinent surgical history.     Home Medications    Prior to Admission medications   Medication Sig Start Date End Date Taking? Authorizing Provider  ALPRAZolam Prudy Feeler) 1 MG tablet Take 1 mg by mouth 3 (three) times daily as needed for anxiety (anxiety).  08/11/14   Historical Provider, MD  citalopram (CELEXA) 20 MG tablet Take 20 mg by mouth daily.    Historical Provider, MD  clonazePAM (KLONOPIN) 0.5 MG tablet Take 0.25 mg by mouth daily.    Historical Provider, MD  divalproex (DEPAKOTE SPRINKLE) 125 MG capsule Take 375 mg by mouth 4 (four) times daily -  with meals and at bedtime.     Historical Provider, MD  docusate sodium (COLACE) 100 MG capsule Take 100 mg by mouth 2 (two) times daily.    Historical Provider, MD  erythromycin ophthalmic ointment [facility] 1 application into the right eye 3 (three) times daily.    Historical Provider, MD  finasteride (PROSCAR) 5 MG tablet Take 5 mg by mouth daily.    Historical Provider, MD  furosemide (LASIX) 20 MG tablet Take 10 mg by mouth daily.    Historical Provider, MD  levothyroxine (SYNTHROID,  LEVOTHROID) 125 MCG tablet Take 125 mcg by mouth daily before breakfast.    Historical Provider, MD  lisinopril (PRINIVIL,ZESTRIL) 5 MG tablet Take 5 mg by mouth daily.    Historical Provider, MD  mirtazapine (REMERON) 15 MG tablet Take 15 mg by mouth every evening.    Historical Provider, MD  OVER THE COUNTER MEDICATION Take 1 Can by mouth 3 (three) times daily. House shake    Historical Provider, MD  QUEtiapine (SEROQUEL) 25 MG tablet Take 25 mg by mouth 2 (two) times daily.    Historical Provider, MD  risperiDONE (RISPERDAL) 0.5 MG tablet Take 0.5 mg by mouth at bedtime.    Historical Provider, MD  tamsulosin (FLOMAX) 0.4 MG CAPS capsule Take 0.4 mg by mouth daily.    Historical Provider, MD    Family History No family history on file.  Social History Social History  Substance Use Topics  . Smoking status: Former Games developer  . Smokeless tobacco: Never Used  . Alcohol use No     Allergies   Patient has no known allergies.   Review of Systems Review of Systems  L5 caveat cannot obtain review of systems due to dementia. Physical Exam Updated Vital Signs BP 115/84 (BP Location: Left Arm)   Pulse 62   Temp 98.4 F (36.9 C) (Oral)   Resp 14   SpO2 94%  Physical Exam  Constitutional: He appears well-developed and well-nourished.  Patient appears confused but is alert and well in appearance. No respiratory distress.  HENT:  Head: Normocephalic and atraumatic.  Right Ear: External ear normal.  Left Ear: External ear normal.  Nose: Nose normal.  Mouth/Throat: Oropharynx is clear and moist.  Eyes: Conjunctivae and EOM are normal. Pupils are equal, round, and reactive to light.  Neck: Neck supple.  No C-spine tenderness to palpation.  Cardiovascular: Normal rate, regular rhythm and normal heart sounds.   No murmur heard. Pulmonary/Chest: Effort normal and breath sounds normal. No respiratory distress. He exhibits no tenderness.  Abdominal: Soft. He exhibits no distension.  There is no tenderness. There is no guarding.  Musculoskeletal: Normal range of motion. He exhibits no edema, tenderness or deformity.  Extremities do not show any evidence of bruising contusion or abrasion. He is using all 4 extremities with normal symmetric function.  Neurological: He is alert.  Patient is very alert but obviously situationally confused. He is somewhat helpful and following commands. His words are clear and to some extent situationally appropriate. He is moving all 4 extremities with symmetric strength and use.  Skin: Skin is warm and dry.  Psychiatric:  Patient exhibits mild situational anxiety and confusion consistent with dementia.  Nursing note and vitals reviewed.    ED Treatments / Results  Labs (all labs ordered are listed, but only abnormal results are displayed) Labs Reviewed - No data to display  EKG  EKG Interpretation None       Radiology No results found.  Procedures Procedures (including critical care time)  Medications Ordered in ED Medications - No data to display   Initial Impression / Assessment and Plan / ED Course  I have reviewed the triage vital signs and the nursing notes.  Pertinent labs & imaging results that were available during my care of the patient were reviewed by me and considered in my medical decision making (see chart for details).      Final Clinical Impressions(s) / ED Diagnoses   Final diagnoses:  Fall, initial encounter  Dementia without behavioral disturbance, unspecified dementia type   Physical examination does not show evidence of acute injury. At this time patient will be returned to nursing home care with precautions to watch for any evidence of pain or changes in baseline function. New Prescriptions New Prescriptions   No medications on file     Arby BarretteMarcy Chancie Lampert, MD 09/14/16 [phone]

## 2016-09-14 NOTE — ED Notes (Signed)
ED Provider at bedside. 

## 2016-09-14 NOTE — ED Triage Notes (Signed)
Per EMS- Patient is a resident of [facility]. Patient had an unwitnessed fall and was on the floor x 5 minutes. No obvious injuries.

## 2016-09-14 NOTE — Discharge Instructions (Signed)
There is a report of a fall in the nursing home. At this time, no injuries are identified. Patient is using all of his extremities without limitation or signs of injury. Please continue to observe for any evidence of injury or other change from normal activity level. Patient should have recheck with his primary care provider within the next 2-3 days.

## 2016-09-17 ENCOUNTER — Emergency Department (HOSPITAL_COMMUNITY)
Admission: EM | Admit: 2016-09-17 | Discharge: 2016-09-17 | Disposition: A | Discharge status: Home / Self Care | Payer: Medicare Other | Attending: Emergency Medicine | Admitting: Emergency Medicine

## 2016-09-17 ENCOUNTER — Encounter (HOSPITAL_COMMUNITY): Payer: Self-pay

## 2016-09-17 DIAGNOSIS — Y939 Activity, unspecified: Secondary | ICD-10-CM | POA: Diagnosis not present

## 2016-09-17 DIAGNOSIS — F0390 Unspecified dementia without behavioral disturbance: Secondary | ICD-10-CM | POA: Insufficient documentation

## 2016-09-17 DIAGNOSIS — Z043 Encounter for examination and observation following other accident: Secondary | ICD-10-CM | POA: Diagnosis not present

## 2016-09-17 DIAGNOSIS — Z79899 Other long term (current) drug therapy: Secondary | ICD-10-CM | POA: Diagnosis not present

## 2016-09-17 DIAGNOSIS — Y999 Unspecified external cause status: Secondary | ICD-10-CM | POA: Diagnosis not present

## 2016-09-17 DIAGNOSIS — W19XXXA Unspecified fall, initial encounter: Secondary | ICD-10-CM | POA: Insufficient documentation

## 2016-09-17 DIAGNOSIS — Z87891 Personal history of nicotine dependence: Secondary | ICD-10-CM | POA: Diagnosis not present

## 2016-09-17 DIAGNOSIS — Y9289 Other specified places as the place of occurrence of the external cause: Secondary | ICD-10-CM | POA: Diagnosis not present

## 2016-09-17 DIAGNOSIS — I10 Essential (primary) hypertension: Secondary | ICD-10-CM | POA: Insufficient documentation

## 2016-09-17 LAB — URINALYSIS, ROUTINE W REFLEX MICROSCOPIC
Bilirubin Urine: NEGATIVE
Glucose, UA: NEGATIVE mg/dL
Hgb urine dipstick: NEGATIVE
Ketones, ur: 5 mg/dL — AB
Leukocytes, UA: NEGATIVE
Nitrite: NEGATIVE
Protein, ur: NEGATIVE mg/dL
Specific Gravity, Urine: 1.027 (ref 1.005–1.030)
pH: 6 (ref 5.0–8.0)

## 2016-09-17 NOTE — ED Notes (Signed)
Reported to [place], [facility] to make aware that he will be coming back shortly. PT[facility] called for transport. ETA should be within the hour.

## 2016-09-17 NOTE — ED Notes (Signed)
Per EMS report:  [facility]  Staff found pt on the ground of his room x approx 30 mins.  AxOx person, event which is about his baseline.  No specific c/o pain.  No obvious injuries per ems.

## 2016-09-17 NOTE — ED Provider Notes (Signed)
[facility]Y DEPT Provider Note   CSN: [id number] Arrival date & time: 09/17/16  1478  By signing my name below, I, Sonum Patel, attest that this documentation has been prepared under the direction and in the presence of Raeford Razor, MD. Electronically Signed: Sonum Patel, Neurosurgeon. 09/17/16. 9:42 AM. History   Chief Complaint Chief Complaint  Patient presents with  . Fall    The history is provided by the patient. The history is limited by the condition of the patient. No language interpreter was used.    LEVEL 5 CAVEAT: Dementia HPI Comments: Steve Hood is a 76 y.o. male brought in by ambulance, who presents to the Emergency Department from SNF status post an unwitnessed fall that occurred PTA. Per staff, patient was found on the ground possibly for 30 minutes. EMS did not report any obvious injuries and per reports patient is confused which is his baseline. He has a history of Alzheimer's disease.   Past Medical History:  Diagnosis Date  . Alzheimer disease   . Benign prostate hyperplasia   . Dementia alzh  . High cholesterol   . Hypertension   . Thyroid disease     Patient Active Problem List   Diagnosis Date Noted  . 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 Aggressive behavior     No past surgical history on file.     Home Medications    Prior to Admission medications   Medication Sig Start Date End Date Taking? Authorizing Provider  ALPRAZolam Prudy Feeler) 1 MG tablet Take 1 mg by mouth 3 (three) times daily as needed for anxiety (anxiety).  08/11/14   Historical Provider, MD  citalopram (CELEXA) 20 MG tablet Take 20 mg by mouth daily.    Historical Provider, MD  clonazePAM (KLONOPIN) 0.5 MG tablet Take 0.25 mg by mouth daily.    Historical Provider, MD  divalproex (DEPAKOTE SPRINKLE) 125 MG capsule Take 375 mg by mouth 4 (four) times daily -  with meals and at bedtime.     Historical Provider, MD  docusate sodium (COLACE) 100 MG capsule Take 100 mg  by mouth 2 (two) times daily.    Historical Provider, MD  erythromycin ophthalmic ointment [facility] 1 application into the right eye 3 (three) times daily.    Historical Provider, MD  finasteride (PROSCAR) 5 MG tablet Take 5 mg by mouth daily.    Historical Provider, MD  furosemide (LASIX) 20 MG tablet Take 10 mg by mouth daily.    Historical Provider, MD  levothyroxine (SYNTHROID, LEVOTHROID) 125 MCG tablet Take 125 mcg by mouth daily before breakfast.    Historical Provider, MD  lisinopril (PRINIVIL,ZESTRIL) 5 MG tablet Take 5 mg by mouth daily.    Historical Provider, MD  mirtazapine (REMERON) 15 MG tablet Take 15 mg by mouth every evening.    Historical Provider, MD  OVER THE COUNTER MEDICATION Take 1 Can by mouth 3 (three) times daily. House shake    Historical Provider, MD  QUEtiapine (SEROQUEL) 25 MG tablet Take 25 mg by mouth 2 (two) times daily.    Historical Provider, MD  risperiDONE (RISPERDAL) 0.5 MG tablet Take 0.5 mg by mouth at bedtime.    Historical Provider, MD  tamsulosin (FLOMAX) 0.4 MG CAPS capsule Take 0.4 mg by mouth daily.    Historical Provider, MD    Family History No family history on file.  Social History Social History  Substance Use Topics  . Smoking status: Former Games developer  . Smokeless tobacco: Never Used  .  Alcohol use No     Allergies   Patient has no known allergies.   Review of Systems Review of Systems  Unable to perform ROS: Dementia     Physical Exam Updated Vital Signs BP 123/77 (BP Location: Left Arm)   Pulse 65   Temp 98.4 F (36.9 C) (Oral)   Resp 15   SpO2 100%   Physical Exam  Constitutional: He appears well-developed and well-nourished.  HENT:  Head: Normocephalic.  Eyes: EOM are normal.  Neck: Normal range of motion.  Cardiovascular: Normal rate, regular rhythm, normal heart sounds and intact distal pulses.   Pulmonary/Chest: Effort normal and breath sounds normal. No respiratory distress.  Abdominal: Soft. He exhibits no  distension. There is no tenderness.  Musculoskeletal: Normal range of motion. He exhibits no edema, tenderness or deformity.  No apparent midline spinal tenderness. ROM of all large joints without pain. No external signs of acute trauma.   Neurological:  Confused per reported baseline. Will open eyes when his name is called. Will respond yes.   Skin: Skin is warm and dry.  Psychiatric: He has a normal mood and affect. Judgment normal.  Nursing note and vitals reviewed.    ED Treatments / Results  DIAGNOSTIC STUDIES: Oxygen Saturation is 100% on RA, normal by my interpretation.    COORDINATION OF CARE:    Labs (all labs ordered are listed, but only abnormal results are displayed) Labs Reviewed - No data to display  EKG  EKG Interpretation None       Radiology No results found. Ct Head Wo Contrast  Result Date: 09/18/2016 CLINICAL DATA:  Status post fall.  Found on the floor in the shower. EXAM: CT HEAD WITHOUT CONTRAST CT CERVICAL SPINE WITHOUT CONTRAST TECHNIQUE: Multidetector CT imaging of the head and cervical spine was performed following the standard protocol without intravenous contrast. Multiplanar CT image reconstructions of the cervical spine were also generated. COMPARISON:  07/18/2016 FINDINGS: CT HEAD FINDINGS Brain: No evidence of acute infarction, hemorrhage, extra-axial collection, ventriculomegaly, or mass effect. Generalized cerebral atrophy. Periventricular white matter low attenuation likely secondary to microangiopathy. Vascular: Cerebrovascular atherosclerotic calcifications are noted. Skull: Negative for fracture or focal lesion. Sinuses/Orbits: Visualized portions of the orbits are unremarkable. Visualized portions of the paranasal sinuses and mastoid air cells are unremarkable. Other: None. CT CERVICAL SPINE FINDINGS Alignment: 2 mm anterolisthesis of C7 on T1 secondary to bilateral facet arthropathy. Skull base and vertebrae: No acute fracture. No primary bone  lesion or focal pathologic process. Soft tissues and spinal canal: No prevertebral fluid or swelling. No visible canal hematoma. Disc levels: Degenerative disc disease with disc height loss at C5-6 and C6-7 and to a much lesser extent C4-5. Moderate left facet arthropathy at C2-3. Moderate right and mild left facet arthropathy at C3-4 with right foraminal stenosis. Moderate left and mild right facet arthropathy at C4-5 with bilateral foraminal stenosis. Broad-based disc osteophyte complex at C5-6 with bilateral uncovertebral degenerative changes and mild bilateral facet arthropathy. Broad-based disc osteophyte complex at C6-7 with mild bilateral facet arthropathy. Moderate bilateral facet arthropathy at C7-T1. Upper chest: Lung apices are clear. Other: No fluid collection or hematoma. Mild bilateral carotid artery atherosclerosis. IMPRESSION: 1. No acute intracranial pathology. 2. No acute osseous injury the cervical spine. 3. Cervical spine spondylosis as described above. Electronically Signed   By: Elige Ko   On: 09/18/2016 13:57   Ct Cervical Spine Wo Contrast  Result Date: 09/18/2016 CLINICAL DATA:  Status post fall.  Found on  the floor in the shower. EXAM: CT HEAD WITHOUT CONTRAST CT CERVICAL SPINE WITHOUT CONTRAST TECHNIQUE: Multidetector CT imaging of the head and cervical spine was performed following the standard protocol without intravenous contrast. Multiplanar CT image reconstructions of the cervical spine were also generated. COMPARISON:  07/18/2016 FINDINGS: CT HEAD FINDINGS Brain: No evidence of acute infarction, hemorrhage, extra-axial collection, ventriculomegaly, or mass effect. Generalized cerebral atrophy. Periventricular white matter low attenuation likely secondary to microangiopathy. Vascular: Cerebrovascular atherosclerotic calcifications are noted. Skull: Negative for fracture or focal lesion. Sinuses/Orbits: Visualized portions of the orbits are unremarkable. Visualized portions of  the paranasal sinuses and mastoid air cells are unremarkable. Other: None. CT CERVICAL SPINE FINDINGS Alignment: 2 mm anterolisthesis of C7 on T1 secondary to bilateral facet arthropathy. Skull base and vertebrae: No acute fracture. No primary bone lesion or focal pathologic process. Soft tissues and spinal canal: No prevertebral fluid or swelling. No visible canal hematoma. Disc levels: Degenerative disc disease with disc height loss at C5-6 and C6-7 and to a much lesser extent C4-5. Moderate left facet arthropathy at C2-3. Moderate right and mild left facet arthropathy at C3-4 with right foraminal stenosis. Moderate left and mild right facet arthropathy at C4-5 with bilateral foraminal stenosis. Broad-based disc osteophyte complex at C5-6 with bilateral uncovertebral degenerative changes and mild bilateral facet arthropathy. Broad-based disc osteophyte complex at C6-7 with mild bilateral facet arthropathy. Moderate bilateral facet arthropathy at C7-T1. Upper chest: Lung apices are clear. Other: No fluid collection or hematoma. Mild bilateral carotid artery atherosclerosis. IMPRESSION: 1. No acute intracranial pathology. 2. No acute osseous injury the cervical spine. 3. Cervical spine spondylosis as described above. Electronically Signed   By: Elige KoHetal  Patel   On: 09/18/2016 13:57   Procedures Procedures (including critical care time)  Medications Ordered in ED Medications - No data to display   Initial Impression / Assessment and Plan / ED Course  I have reviewed the triage vital signs and the nursing notes.  Pertinent labs & imaging results that were available during my care of the patient were reviewed by me and considered in my medical decision making (see chart for details).     Fall. At reported baseline. CT head/cervical spine w/o acute abnormality.   Final Clinical Impressions(s) / ED Diagnoses   Final diagnoses:  Fall, initial encounter    New Prescriptions New Prescriptions   No  medications on file   I personally preformed the services scribed in my presence. The recorded information has been reviewed is accurate. Raeford RazorStephen Davaughn Hillyard, MD.    Raeford RazorStephen Aaradhya Kysar, MD 09/30/16 [phone]

## 2016-09-17 NOTE — ED Notes (Signed)
Bed: [facility]ALB Expected date:  Expected time:  Means of arrival:  Comments: 

## 2016-09-18 ENCOUNTER — Encounter (HOSPITAL_COMMUNITY): Payer: Self-pay | Admitting: Nurse Practitioner

## 2016-09-18 ENCOUNTER — Emergency Department (HOSPITAL_COMMUNITY): Payer: Medicare Other

## 2016-09-18 ENCOUNTER — Emergency Department (HOSPITAL_COMMUNITY)
Admission: EM | Admit: 2016-09-18 | Discharge: 2016-09-18 | Disposition: A | Discharge status: Home / Self Care | Payer: Medicare Other | Attending: Emergency Medicine | Admitting: Emergency Medicine

## 2016-09-18 DIAGNOSIS — W19XXXA Unspecified fall, initial encounter: Secondary | ICD-10-CM | POA: Diagnosis not present

## 2016-09-18 DIAGNOSIS — Y929 Unspecified place or not applicable: Secondary | ICD-10-CM | POA: Insufficient documentation

## 2016-09-18 DIAGNOSIS — I10 Essential (primary) hypertension: Secondary | ICD-10-CM | POA: Insufficient documentation

## 2016-09-18 DIAGNOSIS — G309 Alzheimer's disease, unspecified: Secondary | ICD-10-CM | POA: Insufficient documentation

## 2016-09-18 DIAGNOSIS — Y939 Activity, unspecified: Secondary | ICD-10-CM | POA: Insufficient documentation

## 2016-09-18 DIAGNOSIS — Z87891 Personal history of nicotine dependence: Secondary | ICD-10-CM | POA: Diagnosis not present

## 2016-09-18 DIAGNOSIS — F0280 Dementia in other diseases classified elsewhere without behavioral disturbance: Secondary | ICD-10-CM | POA: Diagnosis not present

## 2016-09-18 DIAGNOSIS — Z043 Encounter for examination and observation following other accident: Secondary | ICD-10-CM | POA: Insufficient documentation

## 2016-09-18 DIAGNOSIS — Y999 Unspecified external cause status: Secondary | ICD-10-CM | POA: Diagnosis not present

## 2016-09-18 LAB — BASIC METABOLIC PANEL
Anion gap: 4 — ABNORMAL LOW (ref 5–15)
BUN: 30 mg/dL — AB (ref 6–20)
CHLORIDE: 112 mmol/L — AB (ref 101–111)
CO2: 29 mmol/L (ref 22–32)
CREATININE: 0.8 mg/dL (ref 0.61–1.24)
Calcium: 8 mg/dL — ABNORMAL LOW (ref 8.9–10.3)
GFR calc Af Amer: >60 mL/min (ref >60)
GFR calc non Af Amer: >60 mL/min (ref >60)
Glucose, Bld: 94 mg/dL (ref 65–99)
Potassium: 3.9 mmol/L (ref 3.5–5.1)
SODIUM: 145 mmol/L (ref 135–145)

## 2016-09-18 LAB — CBC WITH DIFFERENTIAL/PLATELET
Basophils Absolute: 0 10*3/uL (ref 0.0–0.1)
Basophils Relative: 0 %
EOS ABS: 0.2 10*3/uL (ref 0.0–0.7)
Eosinophils Relative: 3 %
HCT: 39.6 % (ref 39.0–52.0)
Hemoglobin: 12.6 g/dL — ABNORMAL LOW (ref 13.0–17.0)
LYMPHS ABS: 2.3 10*3/uL (ref 0.7–4.0)
LYMPHS PCT: 36 %
MCH: 30.8 pg (ref 26.0–34.0)
MCHC: 31.8 g/dL (ref 30.0–36.0)
MCV: 96.8 fL (ref 78.0–100.0)
Monocytes Absolute: 1 10*3/uL (ref 0.1–1.0)
Monocytes Relative: 16 %
Neutro Abs: 2.9 10*3/uL (ref 1.7–7.7)
Neutrophils Relative %: 45 %
PLATELETS: 133 10*3/uL — AB (ref 150–400)
RBC: 4.09 MIL/uL — ABNORMAL LOW (ref 4.22–5.81)
RDW: 14.4 % (ref 11.5–15.5)
WBC: 6.4 10*3/uL (ref 4.0–10.5)

## 2016-09-18 NOTE — ED Notes (Signed)
Pt ambulated with assistance.  

## 2016-09-18 NOTE — ED Notes (Signed)
Bed: [id number] Expected date:  Expected time:  Means of arrival:  Comments: EMS- 76yo M, unwitnessed fall/no complaint/Hx of Alzheimers

## 2016-09-18 NOTE — Discharge Instructions (Signed)
Imaging of the head and neck are normal. Blood work shows some mild dehydration. Please encourage fluids. Please follow up with primary care.

## 2016-09-18 NOTE — ED Triage Notes (Signed)
Patient was found in another residents shower on the floor. The fall was unwitnessed. Patient is at mental baseline. Patient is not on any blood thinners. Has no complaints of pain. Patient has a long history of falls and dementia.

## 2016-09-18 NOTE — ED Notes (Signed)
PTAR is in route

## 2016-09-18 NOTE — ED Notes (Signed)
[facility] [employer]

## 2016-09-18 NOTE — ED Provider Notes (Signed)
[facility]-EMERGENCY DEPT Provider Note   CSN: [id number] Arrival date & time: 09/18/16  1118     History   Chief Complaint No chief complaint on file.   HPI Steve Hood is a 76 y.o. male.  Level V Caveat: Dementia.   Steve Hood is a 76 y.o. Male with a history of dementia and frequent falls presents the emergency department from [facility] after an unwitnessed fall prior to arrival today. Nursing home staff reports that the patient was found in his shower on the floor. It was unwitnessed. He has frequent falls and is checked on frequently. They don't think he was lying in the floor for very long. Per nursing home he gets around poorly with a walker and they are looking to switch him into a wheelchair.  He does not shower by himself. He reported he does have frequent falls and is probably fallen about 7 times in the past month. They deny any changes to his mental status. No changes to his medications recently. He is not on anticoagulants. He's had no fevers recently. On my exam patient has no complaints. He tells me that he "feels really good." He denies other complaints.    The history is provided by the patient, medical records and the nursing home. No language interpreter was used.    Past Medical History:  Diagnosis Date  . Alzheimer disease   . Benign prostate hyperplasia   . Dementia alzh  . High cholesterol   . Hypertension   . Thyroid disease     Patient Active Problem List   Diagnosis Date Noted  . 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 Aggressive behavior     History reviewed. No pertinent surgical history.     Home Medications    Prior to Admission medications   Medication Sig Start Date End Date Taking? Authorizing Provider  ALPRAZolam Prudy Feeler) 1 MG tablet Take 1 mg by mouth 3 (three) times daily as needed for anxiety (anxiety).  08/11/14  Yes Historical Provider, MD  citalopram (CELEXA) 20 MG tablet Take 20 mg by mouth daily.   Yes  Historical Provider, MD  clonazePAM (KLONOPIN) 0.5 MG tablet Take 0.25 mg by mouth daily.   Yes Historical Provider, MD  divalproex (DEPAKOTE SPRINKLE) 125 MG capsule Take 375 mg by mouth 4 (four) times daily -  with meals and at bedtime.    Yes Historical Provider, MD  docusate sodium (COLACE) 100 MG capsule Take 100 mg by mouth 2 (two) times daily.   Yes Historical Provider, MD  erythromycin ophthalmic ointment Place 1 application into the right eye 3 (three) times daily.   Yes Historical Provider, MD  finasteride (PROSCAR) 5 MG tablet Take 5 mg by mouth daily.   Yes Historical Provider, MD  furosemide (LASIX) 20 MG tablet Take 10 mg by mouth daily.   Yes Historical Provider, MD  levothyroxine (SYNTHROID, LEVOTHROID) 125 MCG tablet Take 125 mcg by mouth daily before breakfast.   Yes Historical Provider, MD  lisinopril (PRINIVIL,ZESTRIL) 5 MG tablet Take 5 mg by mouth daily.   Yes Historical Provider, MD  mirtazapine (REMERON) 15 MG tablet Take 15 mg by mouth every evening.   Yes Historical Provider, MD  OVER THE COUNTER MEDICATION Take 1 Can by mouth 3 (three) times daily. House shake   Yes Historical Provider, MD  risperiDONE (RISPERDAL) 0.5 MG tablet Take 0.5 mg by mouth at bedtime.   Yes Historical Provider, MD  tamsulosin (FLOMAX) 0.4 MG CAPS  capsule Take 0.4 mg by mouth daily.   Yes Historical Provider, MD    Family History History reviewed. No pertinent family history.  Social History Social History  Substance Use Topics  . Smoking status: Former Games developermoker  . Smokeless tobacco: Never Used  . Alcohol use No     Allergies   Patient has no known allergies.   Review of Systems Review of Systems  Unable to perform ROS: Dementia  Constitutional: Negative for fever.     Physical Exam Updated Vital Signs BP 109/62 (BP Location: Right Arm)   Pulse 67   Temp 97.9 F (36.6 C) (Oral)   Resp 16   SpO2 95%   Physical Exam  Constitutional: He appears well-developed and  well-nourished. No distress.  Nontoxic appearing.  HENT:  Head: Normocephalic and atraumatic.  Right Ear: External ear normal.  Left Ear: External ear normal.  Mouth/Throat: Oropharynx is clear and moist.  No visible or palpated signs of head trauma.  Eyes: Conjunctivae and EOM are normal. Pupils are equal, round, and reactive to light. Right eye exhibits no discharge. Left eye exhibits no discharge.  Neck: Neck supple.  Wearing C-collar   Cardiovascular: Normal rate, regular rhythm, normal heart sounds and intact distal pulses.  Exam reveals no gallop and no friction rub.   No murmur heard. Pulmonary/Chest: Effort normal and breath sounds normal. No respiratory distress. He has no wheezes. He has no rales. He exhibits no tenderness.  Abdominal: Soft. There is no tenderness.  Musculoskeletal: Normal range of motion. He exhibits no edema or tenderness.  Patient is spontaneously moving all extremities in a coordinated fashion exhibiting good strength. Patient's bilateral shoulder, elbow, wrist, hip, knee and ankle joints are supple and nontender to palpation. Toenail to right 2nd toe is loose and lifted up off his nailbed. Appears chronic from possible fungal toe infection.   Lymphadenopathy:    He has no cervical adenopathy.  Neurological: He is alert. Coordination normal.  Patient is alert and oriented to person only. He is pleasantly demented.  Skin: Skin is warm and dry. Capillary refill takes less than 2 seconds. No rash noted. He is not diaphoretic. No erythema. No pallor.  Psychiatric: He has a normal mood and affect. His behavior is normal.  Nursing note and vitals reviewed.    ED Treatments / Results  Labs (all labs ordered are listed, but only abnormal results are displayed) Labs Reviewed  BASIC METABOLIC PANEL - Abnormal; Notable for the following:       Result Value   Chloride 112 (*)    BUN 30 (*)    Calcium 8.0 (*)    Anion gap 4 (*)    All other components within  normal limits  CBC WITH DIFFERENTIAL/PLATELET - Abnormal; Notable for the following:    RBC 4.09 (*)    Hemoglobin 12.6 (*)    Platelets 133 (*)    All other components within normal limits    EKG  EKG Interpretation None       Radiology Ct Head Wo Contrast  Result Date: 09/18/2016 CLINICAL DATA:  Status post fall.  Found on the floor in the shower. EXAM: CT HEAD WITHOUT CONTRAST CT CERVICAL SPINE WITHOUT CONTRAST TECHNIQUE: Multidetector CT imaging of the head and cervical spine was performed following the standard protocol without intravenous contrast. Multiplanar CT image reconstructions of the cervical spine were also generated. COMPARISON:  07/18/2016 FINDINGS: CT HEAD FINDINGS Brain: No evidence of acute infarction, hemorrhage, extra-axial collection, ventriculomegaly, or  mass effect. Generalized cerebral atrophy. Periventricular white matter low attenuation likely secondary to microangiopathy. Vascular: Cerebrovascular atherosclerotic calcifications are noted. Skull: Negative for fracture or focal lesion. Sinuses/Orbits: Visualized portions of the orbits are unremarkable. Visualized portions of the paranasal sinuses and mastoid air cells are unremarkable. Other: None. CT CERVICAL SPINE FINDINGS Alignment: 2 mm anterolisthesis of C7 on T1 secondary to bilateral facet arthropathy. Skull base and vertebrae: No acute fracture. No primary bone lesion or focal pathologic process. Soft tissues and spinal canal: No prevertebral fluid or swelling. No visible canal hematoma. Disc levels: Degenerative disc disease with disc height loss at C5-6 and C6-7 and to a much lesser extent C4-5. Moderate left facet arthropathy at C2-3. Moderate right and mild left facet arthropathy at C3-4 with right foraminal stenosis. Moderate left and mild right facet arthropathy at C4-5 with bilateral foraminal stenosis. Broad-based disc osteophyte complex at C5-6 with bilateral uncovertebral degenerative changes and mild  bilateral facet arthropathy. Broad-based disc osteophyte complex at C6-7 with mild bilateral facet arthropathy. Moderate bilateral facet arthropathy at C7-T1. Upper chest: Lung apices are clear. Other: No fluid collection or hematoma. Mild bilateral carotid artery atherosclerosis. IMPRESSION: 1. No acute intracranial pathology. 2. No acute osseous injury the cervical spine. 3. Cervical spine spondylosis as described above. Electronically Signed   By: Elige Ko   On: 09/18/2016 13:57   Ct Cervical Spine Wo Contrast  Result Date: 09/18/2016 CLINICAL DATA:  Status post fall.  Found on the floor in the shower. EXAM: CT HEAD WITHOUT CONTRAST CT CERVICAL SPINE WITHOUT CONTRAST TECHNIQUE: Multidetector CT imaging of the head and cervical spine was performed following the standard protocol without intravenous contrast. Multiplanar CT image reconstructions of the cervical spine were also generated. COMPARISON:  07/18/2016 FINDINGS: CT HEAD FINDINGS Brain: No evidence of acute infarction, hemorrhage, extra-axial collection, ventriculomegaly, or mass effect. Generalized cerebral atrophy. Periventricular white matter low attenuation likely secondary to microangiopathy. Vascular: Cerebrovascular atherosclerotic calcifications are noted. Skull: Negative for fracture or focal lesion. Sinuses/Orbits: Visualized portions of the orbits are unremarkable. Visualized portions of the paranasal sinuses and mastoid air cells are unremarkable. Other: None. CT CERVICAL SPINE FINDINGS Alignment: 2 mm anterolisthesis of C7 on T1 secondary to bilateral facet arthropathy. Skull base and vertebrae: No acute fracture. No primary bone lesion or focal pathologic process. Soft tissues and spinal canal: No prevertebral fluid or swelling. No visible canal hematoma. Disc levels: Degenerative disc disease with disc height loss at C5-6 and C6-7 and to a much lesser extent C4-5. Moderate left facet arthropathy at C2-3. Moderate right and mild left  facet arthropathy at C3-4 with right foraminal stenosis. Moderate left and mild right facet arthropathy at C4-5 with bilateral foraminal stenosis. Broad-based disc osteophyte complex at C5-6 with bilateral uncovertebral degenerative changes and mild bilateral facet arthropathy. Broad-based disc osteophyte complex at C6-7 with mild bilateral facet arthropathy. Moderate bilateral facet arthropathy at C7-T1. Upper chest: Lung apices are clear. Other: No fluid collection or hematoma. Mild bilateral carotid artery atherosclerosis. IMPRESSION: 1. No acute intracranial pathology. 2. No acute osseous injury the cervical spine. 3. Cervical spine spondylosis as described above. Electronically Signed   By: Elige Ko   On: 09/18/2016 13:57    Procedures Procedures (including critical care time)  Medications Ordered in ED Medications - No data to display   Initial Impression / Assessment and Plan / ED Course  I have reviewed the triage vital signs and the nursing notes.  Pertinent labs & imaging results that were available during  my care of the patient were reviewed by me and considered in my medical decision making (see chart for details).    This is a 76 y.o. Male with a history of dementia and frequent falls presents the emergency department from [facility] after an unwitnessed fall prior to arrival today. Nursing home staff reports that the patient was found in his shower on the floor. It was unwitnessed. He has frequent falls and is checked on frequently. They don't think he was lying in the floor for very long. Per nursing home he gets around poorly with a walker and they are looking to switch him into a wheelchair.  He does not shower by himself. He reported he does have frequent falls and is probably fallen about 7 times in the past month. They deny any changes to his mental status. No changes to his medications recently. He is not on anticoagulants. He's had no fevers recently. On my exam  patient has no complaints. He tells me that he "feels really good."  On exam the patient is afebrile nontoxic appearing. He is very pleasantly demented. He tells me he feels good and has no pain. I do notice where his third toe nail on his right foot is lifted up. This appears to be chronic from a fungal infection, and not from trauma. No palpated or visible signs of trauma to his body. Patient is able to inflate in the room with some assistance. No pain with ambulation. CT of head and neck were unremarkable. No acute findings. BMP is remarkable for a BUN of 30. CBC is unremarkable. Patient had a urinalysis yesterday during his ER visit that showed no sign of infection. Patient does appear slightly dry. He is encouraged to drink oral liquids in the emergency department successfully. No acute findings today. We'll discharge back to skilled nursing facility and encouraged possible placement in a wheelchair. He is at high fall risk.   This patient was discussed with Dr. Madilyn Hook who agrees with assessment and plan.   Final Clinical Impressions(s) / ED Diagnoses   Final diagnoses:  Fall, initial encounter    New Prescriptions New Prescriptions   No medications on file     Everlene Farrier, PA-C 09/18/16 1559    Tilden Fossa, MD 09/19/16 [phone]

## 2016-09-18 NOTE — ED Notes (Signed)
[facility] picked up patient.  

## 2016-10-17 ENCOUNTER — Emergency Department (HOSPITAL_COMMUNITY): Payer: Medicare Other

## 2016-10-17 ENCOUNTER — Emergency Department (HOSPITAL_COMMUNITY)
Admission: EM | Admit: 2016-10-17 | Discharge: 2016-10-17 | Disposition: A | Discharge status: Home / Self Care | Payer: Medicare Other | Attending: Emergency Medicine | Admitting: Emergency Medicine

## 2016-10-17 ENCOUNTER — Encounter (HOSPITAL_COMMUNITY): Payer: Self-pay | Admitting: Emergency Medicine

## 2016-10-17 DIAGNOSIS — I10 Essential (primary) hypertension: Secondary | ICD-10-CM | POA: Insufficient documentation

## 2016-10-17 DIAGNOSIS — Y999 Unspecified external cause status: Secondary | ICD-10-CM | POA: Diagnosis not present

## 2016-10-17 DIAGNOSIS — S80811A Abrasion, right lower leg, initial encounter: Secondary | ICD-10-CM | POA: Diagnosis present

## 2016-10-17 DIAGNOSIS — G309 Alzheimer's disease, unspecified: Secondary | ICD-10-CM | POA: Insufficient documentation

## 2016-10-17 DIAGNOSIS — Z87891 Personal history of nicotine dependence: Secondary | ICD-10-CM | POA: Diagnosis not present

## 2016-10-17 DIAGNOSIS — J0130 Acute sphenoidal sinusitis, unspecified: Secondary | ICD-10-CM | POA: Diagnosis not present

## 2016-10-17 DIAGNOSIS — Y939 Activity, unspecified: Secondary | ICD-10-CM | POA: Insufficient documentation

## 2016-10-17 DIAGNOSIS — W19XXXA Unspecified fall, initial encounter: Secondary | ICD-10-CM | POA: Diagnosis not present

## 2016-10-17 DIAGNOSIS — Y929 Unspecified place or not applicable: Secondary | ICD-10-CM | POA: Diagnosis not present

## 2016-10-17 MED ORDER — AMOXICILLIN-POT CLAVULANATE 875-125 MG PO TABS: 1.0000 | ORAL_TABLET | Freq: Two times a day (BID) | ORAL | 0 refills | Status: AC

## 2016-10-17 NOTE — ED Notes (Signed)
Bed: WA17 Expected date:  Expected time:  Means of arrival:  Comments: EMS 95, fall

## 2016-10-17 NOTE — ED Triage Notes (Signed)
[facility]. Facility staff found pt lying on floor, normal to baseline. Hx of Alzheimer. Ambulatory.

## 2016-10-17 NOTE — ED Provider Notes (Signed)
[facility]-EMERGENCY DEPT Provider Note   CSN: [id number] Arrival date & time: 10/17/16  0825     History   Chief Complaint Chief Complaint  Patient presents with  . Fall    HPI Steve Hood is a 76 y.o. male.  HPI 76 year old male with history of dementia and recurrent falls who presents with unwitnessed fall. According to the facility, the patient was found lying on the floor on his back. He was otherwise at his mental baseline. Currently, patient is pleasant but demented, limiting history. He does complain of mild neck and headache. He has a superficial abrasion to the right leg. Denies any CP, abdominal pain.  Level 5 caveat invoked as remainder of history, ROS, and physical exam limited due to patient's dementia.   Past Medical History:  Diagnosis Date  . Alzheimer disease   . Benign prostate hyperplasia   . Dementia alzh  . High cholesterol   . Hypertension   . Thyroid disease     Patient Active Problem List   Diagnosis Date Noted  . 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 Aggressive behavior     History reviewed. No pertinent surgical history.     Home Medications    Prior to Admission medications   Medication Sig Start Date End Date Taking? Authorizing Provider  ALPRAZolam Prudy Feeler) 1 MG tablet Take 1 mg by mouth 3 (three) times daily as needed for anxiety (anxiety).  08/11/14  Yes Historical Provider, MD  citalopram (CELEXA) 20 MG tablet Take 20 mg by mouth daily.   Yes Historical Provider, MD  clonazePAM (KLONOPIN) 0.5 MG tablet Take 0.25 mg by mouth daily.   Yes Historical Provider, MD  divalproex (DEPAKOTE SPRINKLE) 125 MG capsule Take 375 mg by mouth 4 (four) times daily -  with meals and at bedtime.    Yes Historical Provider, MD  docusate sodium (COLACE) 100 MG capsule Take 100 mg by mouth 2 (two) times daily.   Yes Historical Provider, MD  erythromycin ophthalmic ointment Place 1 application into the right eye 3 (three) times daily.   Yes  Historical Provider, MD  finasteride (PROSCAR) 5 MG tablet Take 5 mg by mouth daily.   Yes Historical Provider, MD  furosemide (LASIX) 20 MG tablet Take 10 mg by mouth daily.   Yes Historical Provider, MD  levothyroxine (SYNTHROID, LEVOTHROID) 125 MCG tablet Take 125 mcg by mouth daily before breakfast.   Yes Historical Provider, MD  lisinopril (PRINIVIL,ZESTRIL) 5 MG tablet Take 5 mg by mouth daily.   Yes Historical Provider, MD  mirtazapine (REMERON) 15 MG tablet Take 15 mg by mouth every evening.   Yes Historical Provider, MD  OVER THE COUNTER MEDICATION Take 1 Can by mouth 3 (three) times daily. [facility]   Yes Historical Provider, MD  risperiDONE (RISPERDAL) 0.5 MG tablet Take 0.5 mg by mouth at bedtime.   Yes Historical Provider, MD  tamsulosin (FLOMAX) 0.4 MG CAPS capsule Take 0.4 mg by mouth daily.   Yes Historical Provider, MD  amoxicillin-clavulanate (AUGMENTIN) 875-125 MG tablet Take 1 tablet by mouth 2 (two) times daily. 10/17/16 10/27/16  Shaune Pollack, MD    Family History History reviewed. No pertinent family history.  Social History Social History  Substance Use Topics  . Smoking status: Former Games developer  . Smokeless tobacco: Never Used  . Alcohol use No     Allergies   Patient has no known allergies.   Review of Systems Review of Systems  Unable to perform ROS: Dementia  Physical Exam Updated Vital Signs BP 135/76   Pulse 66   Temp 98 F (36.7 C) (Oral)   Resp 17   SpO2 100%   Physical Exam  Constitutional: He appears well-developed and well-nourished. No distress.  HENT:  Head: Normocephalic and atraumatic.  Eyes: Conjunctivae are normal.  Neck: Neck supple.  Mild tenderness to palpation over posterior scalp and paraspinal area of cervical spine. No obvious deformity. No step-offs.  Cardiovascular: Normal rate, regular rhythm and normal heart sounds.  Exam reveals no friction rub.   No murmur heard. Pulmonary/Chest: Effort normal and breath sounds  normal. No respiratory distress. He has no wheezes. He has no rales.  Abdominal: He exhibits no distension.  Musculoskeletal: He exhibits no edema.  Neurological: He is alert. He exhibits normal muscle tone.  Oriented to person only. Moves all extremities with at least antigravity strength. Face is symmetric. Speech is normal. At mental baseline.  Skin: Skin is warm. Capillary refill takes less than 2 seconds.  Superficial abrasion to right shin. No surrounding bruising or tenderness. No active bleeding.  Psychiatric: He has a normal mood and affect.  Nursing note and vitals reviewed.    ED Treatments / Results  Labs (all labs ordered are listed, but only abnormal results are displayed) Labs Reviewed - No data to display  EKG  EKG Interpretation None       Radiology Ct Head Wo Contrast  Result Date: 10/17/2016 CLINICAL DATA:  Unwitnessed fall, found lying on floor, history of Alzheimer disease, hypertension, former smoker EXAM: CT HEAD WITHOUT CONTRAST CT CERVICAL SPINE WITHOUT CONTRAST TECHNIQUE: Multidetector CT imaging of the head and cervical spine was performed following the standard protocol without intravenous contrast. Multiplanar CT image reconstructions of the cervical spine were also generated. COMPARISON:  09/18/2016 FINDINGS: CT HEAD FINDINGS Brain: Generalized atrophy. Normal ventricular morphology. No midline shift or mass effect. Minimal small vessel chronic ischemic changes of deep cerebral white matter. No intracranial hemorrhage, mass lesion, evidence of acute infarction, or extra-axial fluid collection. Vascular: Atherosclerotic calcifications at the carotid siphons and vertebral arteries bilaterally at the skullbase. Skull: Demineralized but intact Sinuses/Orbits: Coastal thickening in maxillary sinuses and a few ethmoid air cells bilaterally. Opacified sphenoid sinus. Mastoid air cells clear. Small amount of fluid within a LEFT ethmoid air cell. Other: N/A CT CERVICAL  SPINE FINDINGS Alignment: Minimal anterolisthesis C7-T1 unchanged. Remaining alignments grossly normal. Skull base and vertebrae: Visualized skullbase intact. Osseous demineralization. Scattered facet degenerative changes. Vertebral body heights maintained without fracture or bone destruction. Sclerosis within the RIGHT C4 vertebral body unchanged. Soft tissues and spinal canal: Prevertebral soft tissues normal thickness. Scattered normal size cervical lymph nodes. Soft tissues otherwise unremarkable. Disc levels: Disc space narrowing with endplate spur formation C4-C5 through C6-C7. Encroachment upon cervical neural foramina bilaterally by small uncovertebral spurs and facet hypertrophy at multiple levels, greatest at LEFT C4-C5. Upper chest: Lung apices clear Other: Atherosclerotic calcifications of the common carotid arteries bilaterally. IMPRESSION: Atrophy with minimal small vessel chronic ischemic changes of deep cerebral white matter. No acute intracranial abnormalities. Scattered sinus disease changes including new opacification of the sphenoid sinus question sinusitis. Multilevel degenerative disc and facet disease changes of the cervical spine. No acute cervical spine abnormalities. Electronically Signed   By: Ulyses SouthwardMark  Boles M.D.   On: 10/17/2016 09:50   Ct Cervical Spine Wo Contrast  Result Date: 10/17/2016 CLINICAL DATA:  Unwitnessed fall, found lying on floor, history of Alzheimer disease, hypertension, former smoker EXAM: CT HEAD WITHOUT CONTRAST  CT CERVICAL SPINE WITHOUT CONTRAST TECHNIQUE: Multidetector CT imaging of the head and cervical spine was performed following the standard protocol without intravenous contrast. Multiplanar CT image reconstructions of the cervical spine were also generated. COMPARISON:  09/18/2016 FINDINGS: CT HEAD FINDINGS Brain: Generalized atrophy. Normal ventricular morphology. No midline shift or mass effect. Minimal small vessel chronic ischemic changes of deep cerebral  white matter. No intracranial hemorrhage, mass lesion, evidence of acute infarction, or extra-axial fluid collection. Vascular: Atherosclerotic calcifications at the carotid siphons and vertebral arteries bilaterally at the skullbase. Skull: Demineralized but intact Sinuses/Orbits: Coastal thickening in maxillary sinuses and a few ethmoid air cells bilaterally. Opacified sphenoid sinus. Mastoid air cells clear. Small amount of fluid within a LEFT ethmoid air cell. Other: N/A CT CERVICAL SPINE FINDINGS Alignment: Minimal anterolisthesis C7-T1 unchanged. Remaining alignments grossly normal. Skull base and vertebrae: Visualized skullbase intact. Osseous demineralization. Scattered facet degenerative changes. Vertebral body heights maintained without fracture or bone destruction. Sclerosis within the RIGHT C4 vertebral body unchanged. Soft tissues and spinal canal: Prevertebral soft tissues normal thickness. Scattered normal size cervical lymph nodes. Soft tissues otherwise unremarkable. Disc levels: Disc space narrowing with endplate spur formation C4-C5 through C6-C7. Encroachment upon cervical neural foramina bilaterally by small uncovertebral spurs and facet hypertrophy at multiple levels, greatest at LEFT C4-C5. Upper chest: Lung apices clear Other: Atherosclerotic calcifications of the common carotid arteries bilaterally. IMPRESSION: Atrophy with minimal small vessel chronic ischemic changes of deep cerebral white matter. No acute intracranial abnormalities. Scattered sinus disease changes including new opacification of the sphenoid sinus question sinusitis. Multilevel degenerative disc and facet disease changes of the cervical spine. No acute cervical spine abnormalities. Electronically Signed   By: Ulyses Southward M.D.   On: 10/17/2016 09:50    Procedures Procedures (including critical care time)  Medications Ordered in ED Medications - No data to display   Initial Impression / Assessment and Plan / ED  Course  I have reviewed the triage vital signs and the nursing notes.  Pertinent labs & imaging results that were available during my care of the patient were reviewed by me and considered in my medical decision making (see chart for details).    76 year old male with history of dementia, not on blood thinners, with history of recurrent falls here with unwitnessed fall. Patient was found down by his bed. There is no obvious trauma or bleeding. On exam, he does have mild posterior neck tenderness as well as superficial abrasion to the right leg. Given his signs of trauma and reported neck pain, CT head and C-spine are obtained and show no acute traumatic abnormality. Incidentally, patient does have sphenoid sinusitis which could be contributing to his headache. He has no fever, is not immune suppressed, and no signs of invasive sinusitis or systemic infection. Will treat with Augmentin and discharged back to his facility. Patient otherwise at his mental baseline.  Final Clinical Impressions(s) / ED Diagnoses   Final diagnoses:  Fall, initial encounter  Abrasion of right lower extremity, initial encounter  Acute non-recurrent sphenoidal sinusitis    New Prescriptions New Prescriptions   AMOXICILLIN-CLAVULANATE (AUGMENTIN) 875-125 MG TABLET    Take 1 tablet by mouth 2 (two) times daily.     Shaune Pollack, MD 10/17/16 [id number]

## 2016-10-28 ENCOUNTER — Emergency Department (HOSPITAL_COMMUNITY)
Admission: EM | Admit: 2016-10-28 | Discharge: 2016-10-28 | Disposition: A | Discharge status: Home / Self Care | Payer: Medicare Other | Attending: Emergency Medicine | Admitting: Emergency Medicine

## 2016-10-28 ENCOUNTER — Emergency Department (HOSPITAL_COMMUNITY): Payer: Medicare Other

## 2016-10-28 ENCOUNTER — Encounter (HOSPITAL_COMMUNITY): Payer: Self-pay | Admitting: Emergency Medicine

## 2016-10-28 DIAGNOSIS — W19XXXA Unspecified fall, initial encounter: Secondary | ICD-10-CM | POA: Insufficient documentation

## 2016-10-28 DIAGNOSIS — Y929 Unspecified place or not applicable: Secondary | ICD-10-CM | POA: Insufficient documentation

## 2016-10-28 DIAGNOSIS — Z87891 Personal history of nicotine dependence: Secondary | ICD-10-CM | POA: Diagnosis not present

## 2016-10-28 DIAGNOSIS — I10 Essential (primary) hypertension: Secondary | ICD-10-CM | POA: Diagnosis not present

## 2016-10-28 DIAGNOSIS — Y999 Unspecified external cause status: Secondary | ICD-10-CM | POA: Diagnosis not present

## 2016-10-28 DIAGNOSIS — F0390 Unspecified dementia without behavioral disturbance: Secondary | ICD-10-CM | POA: Diagnosis not present

## 2016-10-28 DIAGNOSIS — Y939 Activity, unspecified: Secondary | ICD-10-CM | POA: Insufficient documentation

## 2016-10-28 LAB — CBG MONITORING, ED: Glucose-Capillary: 184 mg/dL — ABNORMAL HIGH (ref 65–99)

## 2016-10-28 NOTE — ED Triage Notes (Signed)
[facility]  Had an unwitnessed fall and was found on the floor in another patient's room.  Pt is a high fall risk and has a hx of falls about every other day.  Pt is alert to baseline and has a hx of Alzheimers.  Pt is not on blood thinners.

## 2016-10-28 NOTE — ED Notes (Signed)
Patient has been up out of bed walking in the Steve Hood and having to be guided back to the room.

## 2016-10-28 NOTE — ED Notes (Signed)
Bed: WA06 Expected date:  Expected time:  Means of arrival:  Comments: EMS-fall 

## 2016-10-28 NOTE — ED Notes (Signed)
[facility] called for transport.  

## 2016-10-28 NOTE — ED Provider Notes (Signed)
[facility]-EMERGENCY DEPT Provider Note   CSN: [id number] Arrival date & time: 10/28/16  1103     History   Chief Complaint Chief Complaint  Patient presents with  . Fall    HPI Steve Hood is a 76 y.o. male.  HPI  76 year old male with a history of frequent falls [facility] after being found on the floor in another person's room. The patient currently has no complaints. History is quite limited as the patient is demented. No one witnessed the fall.  Past Medical History:  Diagnosis Date  . Alzheimer disease   . Benign prostate hyperplasia   . Dementia alzh  . High cholesterol   . Hypertension   . Thyroid disease     Patient Active Problem List   Diagnosis Date Noted  . 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 Aggressive behavior     History reviewed. No pertinent surgical history.     Home Medications    Prior to Admission medications   Medication Sig Start Date End Date Taking? Authorizing Provider  ALPRAZolam Prudy Feeler(XANAX) 1 MG tablet Take 1 mg by mouth 3 (three) times daily as needed for anxiety (anxiety).  08/11/14  Yes Historical Provider, MD  citalopram (CELEXA) 20 MG tablet Take 30 mg by mouth at bedtime.    Yes Historical Provider, MD  clonazePAM (KLONOPIN) 0.25 MG disintegrating tablet Take 0.25 mg by mouth daily.   Yes Historical Provider, MD  divalproex (DEPAKOTE SPRINKLE) 125 MG capsule Take 375 mg by mouth 4 (four) times daily -  with meals and at bedtime.    Yes Historical Provider, MD  docusate sodium (COLACE) 100 MG capsule Take 100 mg by mouth 2 (two) times daily.   Yes Historical Provider, MD  erythromycin ophthalmic ointment Place 1 application into the right eye 3 (three) times daily.   Yes Historical Provider, MD  finasteride (PROSCAR) 5 MG tablet Take 5 mg by mouth daily.   Yes Historical Provider, MD  furosemide (LASIX) 20 MG tablet Take 10 mg by mouth daily.   Yes Historical Provider, MD  levothyroxine (SYNTHROID,  LEVOTHROID) 125 MCG tablet Take 125 mcg by mouth daily before breakfast.   Yes Historical Provider, MD  lisinopril (PRINIVIL,ZESTRIL) 5 MG tablet Take 5 mg by mouth daily.   Yes Historical Provider, MD  mirtazapine (REMERON) 15 MG tablet Take 15 mg by mouth every evening.   Yes Historical Provider, MD  NUTRITIONAL SUPPLEMENT LIQD Take 1 Units by mouth 3 (three) times daily. "House shake."   Yes Historical Provider, MD  risperiDONE (RISPERDAL) 0.5 MG tablet Take 0.5 mg by mouth at bedtime.   Yes Historical Provider, MD  tamsulosin (FLOMAX) 0.4 MG CAPS capsule Take 0.4 mg by mouth daily.   Yes Historical Provider, MD  amoxicillin-clavulanate (AUGMENTIN) 875-125 MG tablet Take 1 tablet by mouth 2 (two) times daily. For 10 days, starting 10/18/16    Historical Provider, MD    Family History History reviewed. No pertinent family history.  Social History Social History  Substance Use Topics  . Smoking status: Former Games developermoker  . Smokeless tobacco: Never Used  . Alcohol use No     Allergies   Patient has no known allergies.   Review of Systems Review of Systems  Unable to perform ROS: Dementia     Physical Exam Updated Vital Signs BP 118/62 (BP Location: Right Arm)   Pulse 68   Temp 97.7 F (36.5 C) (Oral)   Resp 20   SpO2 95%  Physical Exam  Constitutional: He appears well-developed and well-nourished. Cervical collar in place.  HENT:  Head: Normocephalic and atraumatic.  Right Ear: External ear normal.  Left Ear: External ear normal.  Nose: Nose normal.  Eyes: Right eye exhibits no discharge. Left eye exhibits no discharge.  Neck: Neck supple.  Cardiovascular: Normal rate, regular rhythm and normal heart sounds.   Pulmonary/Chest: Effort normal and breath sounds normal. No respiratory distress. He exhibits no tenderness.  Abdominal: Soft. He exhibits no distension. There is no tenderness.  Musculoskeletal: He exhibits no edema.  No obvious extremity injuries. No  tenderness in C/T/L spine on palpation. No obvious joint swelling or tenderness including good ROM of shoulder, elbow, knee and hips  Neurological: He is alert. He is disoriented.  Moves all 4 extremities without difficulty  Skin: Skin is warm and dry.  Nursing note and vitals reviewed.    ED Treatments / Results  Labs (all labs ordered are listed, but only abnormal results are displayed) Labs Reviewed  CBG MONITORING, ED - Abnormal; Notable for the following:       Result Value   Glucose-Capillary 184 (*)    All other components within normal limits    EKG  EKG Interpretation None       Radiology Ct Head Wo Contrast  Result Date: 10/28/2016 CLINICAL DATA:  Unwitnessed fall EXAM: CT HEAD WITHOUT CONTRAST CT CERVICAL SPINE WITHOUT CONTRAST TECHNIQUE: Multidetector CT imaging of the head and cervical spine was performed following the standard protocol without intravenous contrast. Multiplanar CT image reconstructions of the cervical spine were also generated. COMPARISON:  10/17/2016 FINDINGS: CT HEAD FINDINGS Brain: Chronic atrophic changes and ischemic changes are again seen. No findings to suggest acute hemorrhage, acute infarction or space-occupying mass lesion are noted. Vascular: No hyperdense vessel or unexpected calcification. Skull: Normal. Negative for fracture or focal lesion. Sinuses/Orbits: Improved aeration of the sphenoid sinus is noted when compare with the prior study. Stable mucosal retention cysts in the right maxillary antrum. Other: None. CT CERVICAL SPINE FINDINGS Alignment: Within normal limits. Skull base and vertebrae: 7 cervical segments are well visualized. Vertebral body height is well maintained. Multilevel osteophytic changes and facet hypertrophic changes are seen. This contributes to bilateral neural foraminal narrowing. No acute fracture or acute facet abnormality is seen. Soft tissues and spinal canal: Within normal limits. Disc levels: Multilevel disc space  narrowing is noted most marked at C5-6 and C6-7. Upper chest: Within normal limits. Other: None IMPRESSION: CT of the head: Chronic atrophic and ischemic changes without acute abnormality. CT of the cervical spine: Multilevel degenerative change without acute abnormality. Electronically Signed   By: Alcide Clever M.D.   On: 10/28/2016 12:52   Ct Cervical Spine Wo Contrast  Result Date: 10/28/2016 CLINICAL DATA:  Unwitnessed fall EXAM: CT HEAD WITHOUT CONTRAST CT CERVICAL SPINE WITHOUT CONTRAST TECHNIQUE: Multidetector CT imaging of the head and cervical spine was performed following the standard protocol without intravenous contrast. Multiplanar CT image reconstructions of the cervical spine were also generated. COMPARISON:  10/17/2016 FINDINGS: CT HEAD FINDINGS Brain: Chronic atrophic changes and ischemic changes are again seen. No findings to suggest acute hemorrhage, acute infarction or space-occupying mass lesion are noted. Vascular: No hyperdense vessel or unexpected calcification. Skull: Normal. Negative for fracture or focal lesion. Sinuses/Orbits: Improved aeration of the sphenoid sinus is noted when compare with the prior study. Stable mucosal retention cysts in the right maxillary antrum. Other: None. CT CERVICAL SPINE FINDINGS Alignment: Within normal limits. Skull base  and vertebrae: 7 cervical segments are well visualized. Vertebral body height is well maintained. Multilevel osteophytic changes and facet hypertrophic changes are seen. This contributes to bilateral neural foraminal narrowing. No acute fracture or acute facet abnormality is seen. Soft tissues and spinal canal: Within normal limits. Disc levels: Multilevel disc space narrowing is noted most marked at C5-6 and C6-7. Upper chest: Within normal limits. Other: None IMPRESSION: CT of the head: Chronic atrophic and ischemic changes without acute abnormality. CT of the cervical spine: Multilevel degenerative change without acute abnormality.  Electronically Signed   By: Alcide Clever M.D.   On: 10/28/2016 12:52    Procedures Procedures (including critical care time)  Medications Ordered in ED Medications - No data to display   Initial Impression / Assessment and Plan / ED Course  I have reviewed the triage vital signs and the nursing notes.  Pertinent labs & imaging results that were available during my care of the patient were reviewed by me and considered in my medical decision making (see chart for details).  Clinical Course as of Oct 28 1332  Mon Oct 28, 2016  1135 No obvious injuries. History quite limited due to dementia.  [SG]    Clinical Course User Index [SG] Pricilla Loveless, MD    Patient has no complaints but is quite demented. However he is at his baseline according to the nurse I spoke to at his living facility. He was last seen approximately 15 minutes before he was found on the floor of another resident's room. No signs of significant injury. He has been ambulating multiple times and thus suspicion of occult hip fracture is quite low. No signs of obvious extremity trauma. Falls frequently, will be discharged back to his facility.  Final Clinical Impressions(s) / ED Diagnoses   Final diagnoses:  Fall, initial encounter    New Prescriptions New Prescriptions   No medications on file     Pricilla Loveless, MD 10/28/16 1335

## 2016-10-28 NOTE — ED Notes (Signed)
Patient still attempting to get out of bed. Patient was soiled. Staff cleaned patient. Patient continues to try to get up.

## 2016-10-28 NOTE — ED Notes (Signed)
Patient attempting to get out of bed. Patient placed on bed alarm.

## 2016-11-02 ENCOUNTER — Emergency Department (HOSPITAL_COMMUNITY)
Admission: EM | Admit: 2016-11-02 | Discharge: 2016-11-02 | Disposition: A | Discharge status: Home / Self Care | Payer: Medicare Other | Attending: Emergency Medicine | Admitting: Emergency Medicine

## 2016-11-02 ENCOUNTER — Encounter (HOSPITAL_COMMUNITY): Payer: Self-pay | Admitting: *Deleted

## 2016-11-02 DIAGNOSIS — F0390 Unspecified dementia without behavioral disturbance: Secondary | ICD-10-CM | POA: Diagnosis present

## 2016-11-02 DIAGNOSIS — I10 Essential (primary) hypertension: Secondary | ICD-10-CM | POA: Insufficient documentation

## 2016-11-02 DIAGNOSIS — W1839XA Other fall on same level, initial encounter: Secondary | ICD-10-CM | POA: Diagnosis not present

## 2016-11-02 DIAGNOSIS — Z79899 Other long term (current) drug therapy: Secondary | ICD-10-CM | POA: Insufficient documentation

## 2016-11-02 DIAGNOSIS — Y999 Unspecified external cause status: Secondary | ICD-10-CM | POA: Insufficient documentation

## 2016-11-02 DIAGNOSIS — Y92129 Unspecified place in nursing home as the place of occurrence of the external cause: Secondary | ICD-10-CM | POA: Insufficient documentation

## 2016-11-02 DIAGNOSIS — Z87891 Personal history of nicotine dependence: Secondary | ICD-10-CM | POA: Insufficient documentation

## 2016-11-02 DIAGNOSIS — W19XXXA Unspecified fall, initial encounter: Secondary | ICD-10-CM

## 2016-11-02 DIAGNOSIS — Y939 Activity, unspecified: Secondary | ICD-10-CM | POA: Insufficient documentation

## 2016-11-02 NOTE — ED Notes (Signed)
Bed: [id number] Expected date:  Expected time:  Means of arrival:  Comments: 76 yo fall- elbow abrasion

## 2016-11-02 NOTE — ED Triage Notes (Signed)
EMS [facility]. Today wandering in unit and fell. Towel roll used as a c-collar, small abrasion on left elbow

## 2016-11-02 NOTE — ED Notes (Signed)
No respiratory or acute distress noted resting in bed with eyes closed pt confused when awake and has to be redirected not to get out of bed no pain voiced.

## 2016-11-02 NOTE — ED Notes (Signed)
[facility] called [facility].

## 2016-11-02 NOTE — ED Provider Notes (Signed)
[facility]-EMERGENCY DEPT Provider Note   CSN: [id number] Arrival date & time: 11/02/16  1743  By signing my name below, I, Steve Hood, attest that this documentation has been prepared under the direction and in the presence of physician practitioner, Steve RazorStephen Elisha Mcgruder, MD. Electronically Signed: Linna Darnerussell Hood, Scribe. 11/02/2016. 6:18 PM.  History   Chief Complaint Chief Complaint  Patient presents with  . Fall    The history is provided by the patient and the EMS personnel. No language interpreter was used.     HPI Comments: LEVEL 5 CAVEAT DUE TO DEMENTIA Steve PortelaRichard J Hood is a 76 y.o. male with PMHx including dementia who presents to the Emergency Department via EMS for evaluation of a fall that occurred at his nursing facility earlier today. Per EMS, patient was walking around his facility and fell. He states he remembers falling but does not know how he fell. Pt denies pain or any other complaints. [facility]  Past Medical History:  Diagnosis Date  . Alzheimer disease   . Benign prostate hyperplasia   . Dementia alzh  . High cholesterol   . Hypertension   . Thyroid disease     Patient Active Problem List   Diagnosis Date Noted  . 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 Aggressive behavior     History reviewed. No pertinent surgical history.     Home Medications    Prior to Admission medications   Medication Sig Start Date End Date Taking? Authorizing Provider  ALPRAZolam Prudy Feeler(XANAX) 1 MG tablet Take 1 mg by mouth 3 (three) times daily as needed for anxiety (anxiety).  08/11/14   Historical Provider, MD  amoxicillin-clavulanate (AUGMENTIN) 875-125 MG tablet Take 1 tablet by mouth 2 (two) times daily. For 10 days, starting 10/18/16    Historical Provider, MD  citalopram (CELEXA) 20 MG tablet Take 30 mg by mouth at bedtime.     Historical Provider, MD  clonazePAM (KLONOPIN) 0.25 MG disintegrating tablet Take 0.25 mg by mouth daily.    Historical  Provider, MD  divalproex (DEPAKOTE SPRINKLE) 125 MG capsule Take 375 mg by mouth 4 (four) times daily -  with meals and at bedtime.     Historical Provider, MD  docusate sodium (COLACE) 100 MG capsule Take 100 mg by mouth 2 (two) times daily.    Historical Provider, MD  erythromycin ophthalmic ointment Place 1 application into the right eye 3 (three) times daily.    Historical Provider, MD  finasteride (PROSCAR) 5 MG tablet Take 5 mg by mouth daily.    Historical Provider, MD  furosemide (LASIX) 20 MG tablet Take 10 mg by mouth daily.    Historical Provider, MD  levothyroxine (SYNTHROID, LEVOTHROID) 125 MCG tablet Take 125 mcg by mouth daily before breakfast.    Historical Provider, MD  lisinopril (PRINIVIL,ZESTRIL) 5 MG tablet Take 5 mg by mouth daily.    Historical Provider, MD  mirtazapine (REMERON) 15 MG tablet Take 15 mg by mouth every evening.    Historical Provider, MD  NUTRITIONAL SUPPLEMENT LIQD Take 1 Units by mouth 3 (three) times daily. "House shake."    Historical Provider, MD  risperiDONE (RISPERDAL) 0.5 MG tablet Take 0.5 mg by mouth at bedtime.    Historical Provider, MD  tamsulosin (FLOMAX) 0.4 MG CAPS capsule Take 0.4 mg by mouth daily.    Historical Provider, MD    Family History No family history on file.  Social History Social History  Substance Use Topics  . Smoking status:  Former Smoker  . Smokeless tobacco: Never Used  . Alcohol use No     Allergies   Patient has no known allergies.   Review of Systems Review of Systems  Unable to perform ROS: Dementia    Physical Exam Updated Vital Signs BP 105/66   Pulse 71   Temp 98.8 F (37.1 C) (Oral)   Resp 16   SpO2 95%   Physical Exam  Constitutional: He appears well-developed and well-nourished. No distress.  Laying in bed. No acute distress.  HENT:  Head: Normocephalic and atraumatic.  Mouth/Throat: Oropharynx is clear and moist. No oropharyngeal exudate.  Eyes: Conjunctivae and EOM are normal.  Pupils are equal, round, and reactive to light. Right eye exhibits no discharge. Left eye exhibits no discharge. No scleral icterus.  Neck: Normal range of motion. Neck supple. No JVD present. No thyromegaly present.  Cardiovascular: Normal rate, regular rhythm, normal heart sounds and intact distal pulses.  Exam reveals no gallop and no friction rub.   No murmur heard. Pulmonary/Chest: Effort normal and breath sounds normal. No respiratory distress. He has no wheezes. He has no rales.  Abdominal: Soft. Bowel sounds are normal. He exhibits no distension and no mass. There is no tenderness.  Musculoskeletal: Normal range of motion. He exhibits no edema or tenderness.  No external signs of trauma. No midline spinal tenderness. No apparent pain with ROM of large joints.  Lymphadenopathy:    He has no cervical adenopathy.  Neurological: He is alert. Coordination normal.  Follows commands. Moves all extremities equally. No focal motor deficits.  Skin: Skin is warm and dry. No rash noted. No erythema.  Psychiatric: He has a normal mood and affect. His behavior is normal.  Nursing note and vitals reviewed.   ED Treatments / Results  Labs (all labs ordered are listed, but only abnormal results are displayed) Labs Reviewed - No data to display  EKG  EKG Interpretation None       Radiology No results found.  Procedures Procedures (including critical care time)  DIAGNOSTIC STUDIES: Oxygen Saturation is 95% on RA, adequate by my interpretation.    Medications Ordered in ED Medications - No data to display   Initial Impression / Assessment and Plan / ED Course  I have reviewed the triage vital signs and the nursing notes.  Pertinent labs & imaging results that were available during my care of the patient were reviewed by me and considered in my medical decision making (see chart for details).     76 year old male presenting for evaluation after a fall. He has dementia and is not a  reliable historian, but for what its worse he denies any pain. He states that he did fall but cannot give me any details further. He is in no apparent distress. He is afebrile and hemodynamically stable. He has no external signs of acute trauma. No bony tenderness of the extremities are apparent pain with range of motion large joints. No midline spinal tenderness. Status reported baseline. I do not feel that he needs emergent workup. Very low suspicion for emergent traumatic injury. He'll be discharged discharged back to his facility.   Final Clinical Impressions(s) / ED Diagnoses   Final diagnoses:  Fall, initial encounter    New Prescriptions New Prescriptions   No medications on file   I personally preformed the services scribed in my presence. The recorded information has been reviewed is accurate. Steve Razor, MD.    Steve Razor, MD 11/12/16 [phone]

## 2016-11-18 ENCOUNTER — Emergency Department (HOSPITAL_COMMUNITY): Payer: Medicare Other

## 2016-11-18 ENCOUNTER — Encounter (HOSPITAL_COMMUNITY): Payer: Self-pay | Admitting: Emergency Medicine

## 2016-11-18 ENCOUNTER — Emergency Department (HOSPITAL_COMMUNITY)
Admission: EM | Admit: 2016-11-18 | Discharge: 2016-11-19 | Disposition: A | Discharge status: Home / Self Care | Payer: Medicare Other | Attending: Emergency Medicine | Admitting: Emergency Medicine

## 2016-11-18 DIAGNOSIS — Z87891 Personal history of nicotine dependence: Secondary | ICD-10-CM | POA: Diagnosis not present

## 2016-11-18 DIAGNOSIS — W19XXXA Unspecified fall, initial encounter: Secondary | ICD-10-CM | POA: Diagnosis not present

## 2016-11-18 DIAGNOSIS — Y999 Unspecified external cause status: Secondary | ICD-10-CM | POA: Diagnosis not present

## 2016-11-18 DIAGNOSIS — S0031XA Abrasion of nose, initial encounter: Secondary | ICD-10-CM | POA: Diagnosis not present

## 2016-11-18 DIAGNOSIS — Y939 Activity, unspecified: Secondary | ICD-10-CM | POA: Diagnosis not present

## 2016-11-18 DIAGNOSIS — G309 Alzheimer's disease, unspecified: Secondary | ICD-10-CM | POA: Diagnosis not present

## 2016-11-18 DIAGNOSIS — S0083XA Contusion of other part of head, initial encounter: Secondary | ICD-10-CM

## 2016-11-18 DIAGNOSIS — I10 Essential (primary) hypertension: Secondary | ICD-10-CM | POA: Insufficient documentation

## 2016-11-18 DIAGNOSIS — Y929 Unspecified place or not applicable: Secondary | ICD-10-CM | POA: Insufficient documentation

## 2016-11-18 DIAGNOSIS — S0993XA Unspecified injury of face, initial encounter: Secondary | ICD-10-CM | POA: Diagnosis present

## 2016-11-18 LAB — URINALYSIS, ROUTINE W REFLEX MICROSCOPIC
BILIRUBIN URINE: NEGATIVE
Glucose, UA: NEGATIVE mg/dL
HGB URINE DIPSTICK: NEGATIVE
Ketones, ur: 5 mg/dL — AB
Leukocytes, UA: NEGATIVE
Nitrite: NEGATIVE
PH: 6 (ref 5.0–8.0)
Protein, ur: NEGATIVE mg/dL
SPECIFIC GRAVITY, URINE: 1.023 (ref 1.005–1.030)

## 2016-11-18 NOTE — ED Triage Notes (Signed)
Pt comes to ed. Via ems, c/o is fall unwitnessed 20 mins ago. [facility] comes from [facility] [address], hx of falls base line alert to name, dementia and alz at baseline.  Pt has neck collar per protocol. V/s 121/79, rr24, pulse 78, [id number]. Pt not on blood thinners. Strong urine smell detected ( rule out UTI)

## 2016-11-18 NOTE — ED Notes (Signed)
Waiting on getting a urine sample until after he has a CT

## 2016-11-18 NOTE — ED Provider Notes (Signed)
[facility]-EMERGENCY DEPT Provider Note   CSN: [id number] Arrival date & time: 11/18/16  1925     History   Chief Complaint Chief Complaint  Patient presents with  . Fall    unwitnessed     HPI  ERNESTINE LANGWORTHY is a 76 y.o. male.HPI 76 year old male with history of Alzheimer's. Resides in an extended care facility.  Unwitnessed fall tonight was noted to have abrasion to histransferred by EMS. There, it was that his "urine smelled strong 2". He does not have recollection for the event. He is awake and alert per her medics and has no complaints.  Past Medical History:  Diagnosis Date  . Alzheimer disease   . Benign prostate hyperplasia   . Dementia alzh  . High cholesterol   . Hypertension   . Thyroid disease     Patient Active Problem List   Diagnosis Date Noted  . 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 Aggressive behavior     History reviewed. No pertinent surgical history.     Home Medications    Prior to Admission medications   Medication Sig Start Date End Date Taking? Authorizing Provider  ALPRAZolam Prudy Feeler) 1 MG tablet Take 1 mg by mouth every 8 (eight) hours as needed for anxiety (anxiety).  08/11/14  Yes Historical Provider, MD  citalopram (CELEXA) 20 MG tablet Take 20 mg by mouth at bedtime.    Yes Historical Provider, MD  clonazePAM (KLONOPIN) 0.25 MG disintegrating tablet Take 0.25 mg by mouth daily.   Yes Historical Provider, MD  divalproex (DEPAKOTE ER) 500 MG 24 hr tablet Take 500 mg by mouth at bedtime.   Yes Historical Provider, MD  docusate sodium (COLACE) 100 MG capsule Take 100 mg by mouth 2 (two) times daily.   Yes Historical Provider, MD  erythromycin ophthalmic ointment Place 1 application into the right eye 3 (three) times daily.   Yes Historical Provider, MD  finasteride (PROSCAR) 5 MG tablet Take 5 mg by mouth daily.   Yes Historical Provider, MD  furosemide (LASIX) 20 MG tablet Take 10 mg by mouth daily.   Yes Historical Provider, MD   levothyroxine (SYNTHROID, LEVOTHROID) 125 MCG tablet Take 125 mcg by mouth daily before breakfast.   Yes Historical Provider, MD  lisinopril (PRINIVIL,ZESTRIL) 5 MG tablet Take 5 mg by mouth daily.   Yes Historical Provider, MD  mirtazapine (REMERON) 15 MG tablet Take 15 mg by mouth every evening.   Yes Historical Provider, MD  NUTRITIONAL SUPPLEMENT LIQD Take 1 Units by mouth 3 (three) times daily. "House shake."   Yes Historical Provider, MD  risperiDONE (RISPERDAL) 0.5 MG tablet Take 0.5 mg by mouth 2 (two) times daily.   Yes Historical Provider, MD  tamsulosin (FLOMAX) 0.4 MG CAPS capsule Take 0.4 mg by mouth daily.   Yes Historical Provider, MD    Family History No family history on file.  Social History Social History  Substance Use Topics  . Smoking status: Former Games developer  . Smokeless tobacco: Never Used  . Alcohol use No     Allergies   Patient has no known allergies.   Review of Systems Review of Systems  Unable to perform ROS: Dementia   Vital 5 caveat dementiaV caviat for dementia  Physical Exam Updated Vital Signs BP 132/73 (BP Location: Right Arm)   Pulse 65   Temp 98.3 F (36.8 C) (Oral)   Resp 18   SpO2 97%   Physical Exam  Constitutional: He appears well-developed and well-nourished. No  distress.  HENT:  Head: Normocephalic.  Nasal abrasion. Normal dentition. No blood over the TMs or mastoids. Normal facial movement and sensation.  Eyes: Conjunctivae are normal. Pupils are equal, round, and reactive to light. No scleral icterus.  Neck: Normal range of motion. Neck supple. No thyromegaly present.  Cardiovascular: Normal rate and regular rhythm.  Exam reveals no gallop and no friction rub.   No murmur heard. Pulmonary/Chest: Effort normal and breath sounds normal. No respiratory distress. He has no wheezes. He has no rales.  Abdominal: Soft. Bowel sounds are normal. He exhibits no distension. There is no tenderness. There is no rebound.    Musculoskeletal: Normal range of motion.  Neurological: He is alert.  Skin: Skin is warm and dry. No rash noted.  Psychiatric: He has a normal mood and affect. His behavior is normal.     ED Treatments / Results  Labs (all labs ordered are listed, but only abnormal results are displayed) Labs Reviewed  URINALYSIS, ROUTINE W REFLEX MICROSCOPIC - Abnormal; Notable for the following:       Result Value   Ketones, ur 5 (*)    All other components within normal limits    EKG  EKG Interpretation None       Radiology Ct Head Wo Contrast  Result Date: 11/18/2016 CLINICAL DATA:  Dementia patient post unwitnessed fall. EXAM: CT HEAD WITHOUT CONTRAST CT CERVICAL SPINE WITHOUT CONTRAST TECHNIQUE: Multidetector CT imaging of the head and cervical spine was performed following the standard protocol without intravenous contrast. Multiplanar CT image reconstructions of the cervical spine were also generated. COMPARISON:  Most recent comparison CT 10/28/2016, additional priors. FINDINGS: CT HEAD FINDINGS Brain: No evidence of acute infarction, hemorrhage, hydrocephalus, extra-axial collection or mass lesion/mass effect. Stable atrophy and chronic small vessel ischemia. Vascular: Atherosclerosis of skullbase vasculature without hyperdense vessel or abnormal calcification. Skull: Normal. Negative for fracture or focal lesion. Sinuses/Orbits: Decreased mucosal thickening of the maxillary sinuses. No fluid levels. No acute findings. Other: None. CT CERVICAL SPINE FINDINGS Alignment: Minimal anterolisthesis of C7 on T1, unchanged from prior exams. No acute subluxation, jumped or perched facets. Skull base and vertebrae: No acute fracture. Stable scleroses within C4 vertebral body. Vertebral body heights are maintained. Skullbase is intact. Bones are under mineralized. Soft tissues and spinal canal: No prevertebral fluid or swelling. No visible canal hematoma. Disc levels: Disc space narrowing and endplate  spurring is most prominent at C5-C6 and C6-C7. There is multilevel facet arthropathy. Degenerative changes are stable from prior. Upper chest: No acute abnormality. Other: Carotid vascular calcifications. IMPRESSION: 1. No acute intracranial abnormality. In stable atrophy and chronic small vessel ischemia. 2. Stable degenerative change in the cervical spine without acute fracture or subluxation. Electronically Signed   By: Rubye Oaks M.D.   On: 11/18/2016 21:15   Ct Cervical Spine Wo Contrast  Result Date: 11/18/2016 CLINICAL DATA:  Dementia patient post unwitnessed fall. EXAM: CT HEAD WITHOUT CONTRAST CT CERVICAL SPINE WITHOUT CONTRAST TECHNIQUE: Multidetector CT imaging of the head and cervical spine was performed following the standard protocol without intravenous contrast. Multiplanar CT image reconstructions of the cervical spine were also generated. COMPARISON:  Most recent comparison CT 10/28/2016, additional priors. FINDINGS: CT HEAD FINDINGS Brain: No evidence of acute infarction, hemorrhage, hydrocephalus, extra-axial collection or mass lesion/mass effect. Stable atrophy and chronic small vessel ischemia. Vascular: Atherosclerosis of skullbase vasculature without hyperdense vessel or abnormal calcification. Skull: Normal. Negative for fracture or focal lesion. Sinuses/Orbits: Decreased mucosal thickening of the maxillary sinuses.  No fluid levels. No acute findings. Other: None. CT CERVICAL SPINE FINDINGS Alignment: Minimal anterolisthesis of C7 on T1, unchanged from prior exams. No acute subluxation, jumped or perched facets. Skull base and vertebrae: No acute fracture. Stable scleroses within C4 vertebral body. Vertebral body heights are maintained. Skullbase is intact. Bones are under mineralized. Soft tissues and spinal canal: No prevertebral fluid or swelling. No visible canal hematoma. Disc levels: Disc space narrowing and endplate spurring is most prominent at C5-C6 and C6-C7. There is  multilevel facet arthropathy. Degenerative changes are stable from prior. Upper chest: No acute abnormality. Other: Carotid vascular calcifications. IMPRESSION: 1. No acute intracranial abnormality. In stable atrophy and chronic small vessel ischemia. 2. Stable degenerative change in the cervical spine without acute fracture or subluxation. Electronically Signed   By: Rubye OaksMelanie  Ehinger M.D.   On: 11/18/2016 21:15    Procedures Procedures (including critical care time)  Medications Ordered in ED Medications - No data to display   Initial Impression / Assessment and Plan / ED Course  I have reviewed the triage vital signs and the nursing notes.  Pertinent labs & imaging results that were available during my care of the patient were reviewed by me and considered in my medical decision making (see chart for details).     CT of head and neck show no acute findings. Urine is infected. He is appropriate for transfer back to his facility.   Final Clinical Impressions(s) / ED Diagnoses   Final diagnoses:  Contusion of face, initial encounter    New Prescriptions New Prescriptions   No medications on file     Rolland PorterMark Fong Mccarry, MD 11/18/16 [id number]

## 2016-11-18 NOTE — ED Notes (Signed)
Bed: WA09 Expected date:  Expected time:  Means of arrival:  Comments: EMS-fall/nursing home

## 2016-11-18 NOTE — Discharge Instructions (Signed)
Follow-up with your regular physician as needed.

## 2016-11-26 ENCOUNTER — Emergency Department (HOSPITAL_COMMUNITY)
Admission: EM | Admit: 2016-11-26 | Discharge: 2016-11-26 | Disposition: A | Discharge status: Home / Self Care | Payer: Medicare Other | Attending: Emergency Medicine | Admitting: Emergency Medicine

## 2016-11-26 ENCOUNTER — Emergency Department (HOSPITAL_COMMUNITY): Payer: Medicare Other

## 2016-11-26 DIAGNOSIS — Z79899 Other long term (current) drug therapy: Secondary | ICD-10-CM | POA: Insufficient documentation

## 2016-11-26 DIAGNOSIS — W1830XA Fall on same level, unspecified, initial encounter: Secondary | ICD-10-CM | POA: Diagnosis not present

## 2016-11-26 DIAGNOSIS — Y929 Unspecified place or not applicable: Secondary | ICD-10-CM | POA: Insufficient documentation

## 2016-11-26 DIAGNOSIS — Z87891 Personal history of nicotine dependence: Secondary | ICD-10-CM | POA: Diagnosis not present

## 2016-11-26 DIAGNOSIS — S0990XA Unspecified injury of head, initial encounter: Secondary | ICD-10-CM | POA: Diagnosis present

## 2016-11-26 DIAGNOSIS — I10 Essential (primary) hypertension: Secondary | ICD-10-CM | POA: Diagnosis not present

## 2016-11-26 DIAGNOSIS — Y939 Activity, unspecified: Secondary | ICD-10-CM | POA: Insufficient documentation

## 2016-11-26 DIAGNOSIS — W19XXXA Unspecified fall, initial encounter: Secondary | ICD-10-CM

## 2016-11-26 DIAGNOSIS — G309 Alzheimer's disease, unspecified: Secondary | ICD-10-CM | POA: Insufficient documentation

## 2016-11-26 DIAGNOSIS — Y999 Unspecified external cause status: Secondary | ICD-10-CM | POA: Insufficient documentation

## 2016-11-26 NOTE — ED Provider Notes (Signed)
[facility]-EMERGENCY DEPT Provider Note   CSN: [id number] Arrival date & time: 11/26/16  1551     History   Chief Complaint Chief Complaint  Patient presents with  . Fall    HPI Steve Hood is a 76 y.o. male.  HPI 76 year old Caucasian male past medical history significant for Alzheimer's, dementia, hypertension that presents to the ED today by EMS from nursing facility for 2 witnessed falls prior to arrival. Patient has a history of falls in the midchest been seen in the ED several times for same. Patient denies any pain. Spoke with the nursing staff at the facility who states that they are unsure why patient fell however Steve Hood does have a history of falls. They deny patient having LOC. They do report patient hitting the back of his head on and other residents wheelchair. Patient was helped up by nursing staff. Unable to obtain history due to patient due to baseline dementia. Does not report any pain at this time. Past Medical History:  Diagnosis Date  . Alzheimer disease   . Benign prostate hyperplasia   . Dementia alzh  . High cholesterol   . Hypertension   . Thyroid disease     Patient Active Problem List   Diagnosis Date Noted  . 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 Aggressive behavior     No past surgical history on file.     Home Medications    Prior to Admission medications   Medication Sig Start Date End Date Taking? Authorizing Provider  ALPRAZolam Prudy Feeler) 1 MG tablet Take 1 mg by mouth every 8 (eight) hours as needed for anxiety (anxiety).  08/11/14   Historical Provider, MD  citalopram (CELEXA) 20 MG tablet Take 20 mg by mouth at bedtime.     Historical Provider, MD  clonazePAM (KLONOPIN) 0.25 MG disintegrating tablet Take 0.25 mg by mouth daily.    Historical Provider, MD  divalproex (DEPAKOTE ER) 500 MG 24 hr tablet Take 500 mg by mouth at bedtime.    Historical Provider, MD  docusate sodium (COLACE) 100 MG capsule Take 100 mg by mouth 2 (two)  times daily.    Historical Provider, MD  erythromycin ophthalmic ointment Place 1 application into the right eye 3 (three) times daily.    Historical Provider, MD  finasteride (PROSCAR) 5 MG tablet Take 5 mg by mouth daily.    Historical Provider, MD  furosemide (LASIX) 20 MG tablet Take 10 mg by mouth daily.    Historical Provider, MD  levothyroxine (SYNTHROID, LEVOTHROID) 125 MCG tablet Take 125 mcg by mouth daily before breakfast.    Historical Provider, MD  lisinopril (PRINIVIL,ZESTRIL) 5 MG tablet Take 5 mg by mouth daily.    Historical Provider, MD  mirtazapine (REMERON) 15 MG tablet Take 15 mg by mouth every evening.    Historical Provider, MD  NUTRITIONAL SUPPLEMENT LIQD Take 1 Units by mouth 3 (three) times daily. "House shake."    Historical Provider, MD  risperiDONE (RISPERDAL) 0.5 MG tablet Take 0.5 mg by mouth 2 (two) times daily.    Historical Provider, MD  tamsulosin (FLOMAX) 0.4 MG CAPS capsule Take 0.4 mg by mouth daily.    Historical Provider, MD    Family History No family history on file.  Social History Social History  Substance Use Topics  . Smoking status: Former Games developer  . Smokeless tobacco: Never Used  . Alcohol use No     Allergies   Patient has no known allergies.   Review of  Systems Review of Systems  Unable to perform ROS: Dementia     Physical Exam Updated Vital Signs BP 107/64 (BP Location: Left Arm)   Pulse 73   Temp 98.3 F (36.8 C) (Oral)   Resp 16   SpO2 99%   Physical Exam  Constitutional: Steve Hood appears well-developed and well-nourished. No distress.  Very pleasant and demented at baseline  HENT:  Head: Normocephalic and atraumatic.  Mouth/Throat: Oropharynx is clear and moist.  Do not appreciate any abrasions, hematoma: The back of the head.  Eyes: Conjunctivae are normal. Right eye exhibits no discharge. Left eye exhibits no discharge. No scleral icterus.  Neck: Normal range of motion. Neck supple. No thyromegaly present.  No  midline pain. No paraspinal pain. Full range of motion. No deformity or step-offs noted.  Cardiovascular: Normal rate, regular rhythm and intact distal pulses.  Exam reveals no gallop and no friction rub.   No murmur heard. Pulmonary/Chest: Effort normal and breath sounds normal. No respiratory distress. Steve Hood has no wheezes. Steve Hood has no rales. Steve Hood exhibits no tenderness.  Abdominal: Soft. Bowel sounds are normal. Steve Hood exhibits no distension. There is no tenderness. There is no rebound and no guarding.  No echymosis   Musculoskeletal: Normal range of motion.  No midline pain. No deformity step-offs noted. Pelvis is stable. Pt moves all 4 extremities without any difficulty.   Lymphadenopathy:    Steve Hood has no cervical adenopathy.  Neurological: Steve Hood is alert.  Unable to preform neuro exam due to dementia  Skin: Skin is warm and dry. Capillary refill takes less than 2 seconds.  Nursing note and vitals reviewed.    ED Treatments / Results  Labs (all labs ordered are listed, but only abnormal results are displayed) Labs Reviewed - No data to display  EKG  EKG Interpretation None       Radiology Ct Head Wo Contrast  Result Date: 11/26/2016 CLINICAL DATA:  Pain following fall EXAM: CT HEAD WITHOUT CONTRAST CT CERVICAL SPINE WITHOUT CONTRAST TECHNIQUE: Multidetector CT imaging of the head and cervical spine was performed following the standard protocol without intravenous contrast. Multiplanar CT image reconstructions of the cervical spine were also generated. COMPARISON:  November 18, 2016 CT head and CT cervical spine FINDINGS: CT HEAD FINDINGS Brain: Moderate diffuse atrophy is stable. There is no intracranial mass, hemorrhage, extra-axial fluid collection, or midline shift. There is minimal periventricular small vessel disease in the centra semiovale bilaterally. Elsewhere gray-white compartments appear normal. No acute infarct evident. Vascular: No hyperdense vessel. There are foci of calcification in  the left carotid siphon region. Skull: Bony calvarium appears intact. Sinuses/Orbits: There is mild mucosal thickening in the inferior right maxillary antrum. There is mild mucosal thickening in several ethmoid air cells bilaterally. Other paranasal sinuses are clear. No intraorbital lesions are evident. Other: Mastoid air cells are clear. CT CERVICAL SPINE FINDINGS Alignment: There is again noted minimal anterolisthesis of C7 on T1. No new spondylolisthesis. Skull base and vertebrae: The skull base and craniocervical junction regions appear normal. There is no evident fracture. Bones do appear somewhat osteoporotic. There are no blastic or lytic bone lesions. Soft tissues and spinal canal: Prevertebral soft tissues and predental space regions are normal. There is no paraspinous lesion. There is no cord or canal hematoma evident. Disc levels: There is moderately severe disc space narrowing at C5-6 and C6-7. There is moderate disc space narrowing at C4-5. There is facet hypertrophy at multiple levels. Exit foraminal narrowing due to bony hypertrophy is notable  on the right at C3-4, at C4-5 bilaterally, and at C5-6 on the right, most severe at C4-5 bilaterally were there is severe impression on the exiting nerve roots bilaterally. There is no frank disc extrusion or stenosis. Upper chest: Visualized upper lung regions are clear. There is calcification in the left subclavian artery. Other: There is calcification in each carotid artery. IMPRESSION: CT head: Stable atrophy with slight periventricular small vessel disease. No intracranial mass, hemorrhage, or extra-axial fluid collection. No acute infarct. Mild vascular calcification noted. Mild paranasal sinus disease noted. CT cervical spine: No demonstrable fracture. Minimal anterolisthesis of C7 on T1 is stable. No new spondylolisthesis. 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 There are areas of vascular calcification in both carotid arteries as well as in the  proximal left subclavian artery. Electronically Signed   By: Bretta Bang III M.D.   On: 11/26/2016 19:43   Ct Cervical Spine Wo Contrast  Result Date: 11/26/2016 CLINICAL DATA:  Pain following fall EXAM: CT HEAD WITHOUT CONTRAST CT CERVICAL SPINE WITHOUT CONTRAST TECHNIQUE: Multidetector CT imaging of the head and cervical spine was performed following the standard protocol without intravenous contrast. Multiplanar CT image reconstructions of the cervical spine were also generated. COMPARISON:  November 18, 2016 CT head and CT cervical spine FINDINGS: CT HEAD FINDINGS Brain: Moderate diffuse atrophy is stable. There is no intracranial mass, hemorrhage, extra-axial fluid collection, or midline shift. There is minimal periventricular small vessel disease in the centra semiovale bilaterally. Elsewhere gray-white compartments appear normal. No acute infarct evident. Vascular: No hyperdense vessel. There are foci of calcification in the left carotid siphon region. Skull: Bony calvarium appears intact. Sinuses/Orbits: There is mild mucosal thickening in the inferior right maxillary antrum. There is mild mucosal thickening in several ethmoid air cells bilaterally. Other paranasal sinuses are clear. No intraorbital lesions are evident. Other: Mastoid air cells are clear. CT CERVICAL SPINE FINDINGS Alignment: There is again noted minimal anterolisthesis of C7 on T1. No new spondylolisthesis. Skull base and vertebrae: The skull base and craniocervical junction regions appear normal. There is no evident fracture. Bones do appear somewhat osteoporotic. There are no blastic or lytic bone lesions. Soft tissues and spinal canal: Prevertebral soft tissues and predental space regions are normal. There is no paraspinous lesion. There is no cord or canal hematoma evident. Disc levels: There is moderately severe disc space narrowing at C5-6 and C6-7. There is moderate disc space narrowing at C4-5. There is facet hypertrophy at  multiple levels. Exit foraminal narrowing due to bony hypertrophy is notable on the right at C3-4, at C4-5 bilaterally, and at C5-6 on the right, most severe at C4-5 bilaterally were there is severe impression on the exiting nerve roots bilaterally. There is no frank disc extrusion or stenosis. Upper chest: Visualized upper lung regions are clear. There is calcification in the left subclavian artery. Other: There is calcification in each carotid artery. IMPRESSION: CT head: Stable atrophy with slight periventricular small vessel disease. No intracranial mass, hemorrhage, or extra-axial fluid collection. No acute infarct. Mild vascular calcification noted. Mild paranasal sinus disease noted. CT cervical spine: No demonstrable fracture. Minimal anterolisthesis of C7 on T1 is stable. No new spondylolisthesis. 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 There are areas of vascular calcification in both carotid arteries as well as in the proximal left subclavian artery. Electronically Signed   By: Bretta Bang III M.D.   On: 11/26/2016 19:43    Procedures Procedures (including critical care time)  Medications Ordered in ED Medications - No  data to display   Initial Impression / Assessment and Plan / ED Course  I have reviewed the triage vital signs and the nursing notes.  Pertinent labs & imaging results that were available during my care of the patient were reviewed by me and considered in my medical decision making (see chart for details).     Patient presents to the ED from nursing facility today with 2 witnessed falls. Likely mechanical in nature. Spoke to the nursing home states the patient did not lose consciousness. They did state the patient did hit the back of his head on the wheelchair. I do not appreciate any abrasions, contusions, hematomas to the posterior occiput. Neck is supple without any deformities. CT head and cervical spine was ordered with no acute abnormalities. Patient was  fully undressed and examined and shows no other signs of abrasions or contusions. No midline back pain. Pelvis is stable. Moving all 4 extremities without any difficulties. Vital signs are stable. Nursing staff says the patient did not lose consciousness. Patient has history of falls along with dementia. Has been seen several times in the ED for fall. Patient has no complaints. Feels stable for discharge back to the facility. Pt seen and examined by Dr. Ethelda Chick who is agreeable to the above plan.   Final Clinical Impressions(s) / ED Diagnoses   Final diagnoses:  Fall, initial encounter  Injury of head, initial encounter    New Prescriptions New Prescriptions   No medications on file     Rise Mu, PA-C 11/26/16 2053    Doug Sou, MD 11/27/16 [phone]

## 2016-11-26 NOTE — ED Triage Notes (Signed)
Pt presents from [facility] after having two witnessed falls in one hour. Hx of falls and dementia. Denies pain. No head trauma. Sent out per protocol for facility. Alert.

## 2016-11-26 NOTE — ED Notes (Signed)
Bed: [facility] Expected date:  Expected time:  Means of arrival:  Comments: EMS fall  

## 2016-11-26 NOTE — ED Notes (Signed)
Patient was alert, and stable upon discharge. RN went over AVS and patient had no further questions.  

## 2016-11-26 NOTE — ED Provider Notes (Signed)
Resting comfortably after reported fall today. He denies pain anywhere. He is alert and appears in no distress   Doug Sou, MD 11/26/16 2048

## 2016-11-26 NOTE — Discharge Instructions (Signed)
Patient had a CAT scan of head and neck that was normal today. Do not see any signs of injury. Vital signs are normal. We'll transfer back to facility with follow-up with primary care doctor. Return if there are worsening symptoms.

## 2016-11-28 ENCOUNTER — Emergency Department (HOSPITAL_COMMUNITY)
Admission: EM | Admit: 2016-11-28 | Discharge: 2016-11-29 | Disposition: A | Discharge status: Home / Self Care | Payer: Medicare Other | Attending: Emergency Medicine | Admitting: Emergency Medicine

## 2016-11-28 ENCOUNTER — Encounter (HOSPITAL_COMMUNITY): Payer: Self-pay

## 2016-11-28 ENCOUNTER — Emergency Department (HOSPITAL_COMMUNITY): Payer: Medicare Other

## 2016-11-28 DIAGNOSIS — Y929 Unspecified place or not applicable: Secondary | ICD-10-CM | POA: Insufficient documentation

## 2016-11-28 DIAGNOSIS — I10 Essential (primary) hypertension: Secondary | ICD-10-CM | POA: Insufficient documentation

## 2016-11-28 DIAGNOSIS — W19XXXA Unspecified fall, initial encounter: Secondary | ICD-10-CM | POA: Diagnosis not present

## 2016-11-28 DIAGNOSIS — Z87891 Personal history of nicotine dependence: Secondary | ICD-10-CM | POA: Diagnosis not present

## 2016-11-28 DIAGNOSIS — S199XXA Unspecified injury of neck, initial encounter: Secondary | ICD-10-CM | POA: Diagnosis present

## 2016-11-28 DIAGNOSIS — Y999 Unspecified external cause status: Secondary | ICD-10-CM | POA: Diagnosis not present

## 2016-11-28 DIAGNOSIS — Y939 Activity, unspecified: Secondary | ICD-10-CM | POA: Insufficient documentation

## 2016-11-28 DIAGNOSIS — S0990XA Unspecified injury of head, initial encounter: Secondary | ICD-10-CM | POA: Diagnosis not present

## 2016-11-28 DIAGNOSIS — S161XXA Strain of muscle, fascia and tendon at neck level, initial encounter: Secondary | ICD-10-CM

## 2016-11-28 DIAGNOSIS — G309 Alzheimer's disease, unspecified: Secondary | ICD-10-CM | POA: Diagnosis not present

## 2016-11-28 NOTE — ED Notes (Signed)
Bed: [facility] Expected date:  Expected time:  Means of arrival:  Comments: fall 

## 2016-11-28 NOTE — ED Notes (Signed)
Bed: [facility] Expected date:  Expected time:  Means of arrival:  Comments: 

## 2016-11-28 NOTE — ED Notes (Signed)
Pt is alert and verbally responsive and is alert to self. Pt is unable to localize and pain or discomfort and no nonverbal cues of discomfort are noted. No apparent abrasions or contusions are noted.

## 2016-11-28 NOTE — Discharge Instructions (Signed)

## 2016-11-28 NOTE — ED Notes (Signed)
Bed: WA17 Expected date:  Expected time:  Means of arrival:  Comments: EMS fall 

## 2016-11-28 NOTE — ED Notes (Signed)
[facility] called  

## 2016-11-28 NOTE — ED Provider Notes (Signed)
Emergency Department Provider Note   I have reviewed the triage vital signs and the nursing notes.  Level 5 caveat: Dementia   HISTORY  Chief Complaint Fall   HPI Steve Hood is a 76 y.o. male with PMH of alzheimer's dementia, BPH, HTN, and HLD presents to the emergency department by EMS after being found down. Staff is unsure if he fell directly or slid to the floor. Patient has significant dementia and is unable to recall her report any detail from the event. Nursing home denies any fever, chills, medication changes. Patient has had multiple ED presentations for similar complaint.   Past Medical History:  Diagnosis Date  . Alzheimer disease   . Benign prostate hyperplasia   . Dementia alzh  . High cholesterol   . Hypertension   . Thyroid disease     Patient Active Problem List   Diagnosis Date Noted  . 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 Aggressive behavior     History reviewed. No pertinent surgical history.  Current Outpatient Rx  . Order #: [id number] Class: Historical Med  . Order #: [id number] Class: Historical Med  . Order #: [id number] Class: Historical Med  . Order #: [id number] Class: Historical Med  . Order #: [id number] Class: Historical Med  . Order #: [id number] Class: Historical Med  . Order #: [id number] Class: Historical Med  . Order #: [id number] Class: Historical Med  . Order #: [id number] Class: Historical Med  . Order #: [id number] Class: Historical Med  . Order #: [id number] Class: Historical Med  . Order #: [id number] Class: Historical Med  . Order #: [id number] Class: Historical Med  . Order #: [id number] Class: Historical Med    Allergies Patient has no known allergies.  History reviewed. No pertinent family history.  Social History Social History  Substance Use Topics  . Smoking status: Former Games developer  . Smokeless tobacco: Never Used  . Alcohol use No    Review of Systems  Level 5 caveat:  dementia  ____________________________________________   PHYSICAL EXAM:  VITAL SIGNS: ED Triage Vitals  Enc Vitals Group     BP 11/28/16 1917 106/70     Pulse Rate 11/28/16 1917 78     Resp 11/28/16 1917 18     Temp 11/28/16 1918 97.7 F (36.5 C)     Temp Source 11/28/16 1918 Oral     SpO2 11/28/16 1917 95 %   Constitutional: Alert but confused. Well appearing and in no acute distress. Eyes: Conjunctivae are normal. PERRL. Head: Atraumatic. Nose: No congestion/rhinnorhea. Mouth/Throat: Mucous membranes are moist.  Neck: No stridor. No cervical spine tenderness to palpation. Cardiovascular: Normal rate, regular rhythm. Good peripheral circulation. Grossly normal heart sounds.   Respiratory: Normal respiratory effort.  No retractions. Lungs CTAB. Gastrointestinal: Soft and nontender. No distention.  Musculoskeletal: No lower extremity tenderness nor edema. No gross deformities of extremities. Neurologic: No gross focal neurologic deficits are appreciated.  Skin:  Skin is warm, dry and intact. No rash noted.  ____________________________________________  RADIOLOGY  Ct Head Wo Contrast  Result Date: 11/28/2016 CLINICAL DATA:  Dementia. Possible loss of consciousness after fall. EXAM: CT HEAD WITHOUT CONTRAST CT CERVICAL SPINE WITHOUT CONTRAST TECHNIQUE: Multidetector CT imaging of the head and cervical spine was performed following the standard protocol without intravenous contrast. Multiplanar CT image reconstructions of the cervical spine were also generated. COMPARISON:  11/26/2016 FINDINGS: CT HEAD FINDINGS BRAIN: There is sulcal and ventricular prominence consistent with superficial and central atrophy. No intraparenchymal hemorrhage, mass effect nor midline shift. Periventricular and subcortical white matter hypodensities consistent  with chronic small vessel ischemic disease are identified. No acute large vascular territory infarcts. No abnormal extra-axial fluid collections.  Basal cisterns are not effaced and midline. VASCULAR: No hyperdense vessels. Left carotid siphon calcifications. SKULL: No skull fracture. No significant scalp soft tissue swelling. SINUSES/ORBITS: The mastoid air-cells are clear. The included paranasal sinuses are well-aerated. Minimal mucosal thickening of the ethmoid sinus.The included ocular globes and orbital contents are non-suspicious. OTHER: None. CT CERVICAL SPINE FINDINGS Alignment: Slight reversal cervical lordosis with apex at C5-6 secondary to degenerative disc disease. Skull base and vertebrae: No vertebral body fracture. Intact skullbase. The craniocervical relationship is maintained. Soft tissues and spinal canal: No prevertebral soft tissue swelling. No intraspinal hemorrhage. Disc levels: Moderate to severe disc space narrowing at C5-6 and C6-7 with small posterior marginal osteophytes. Moderate disc space narrowing is also noted at C4-5. Multilevel facet hypertrophy with foraminal narrowing on the right at C3-4, C4-5 bilaterally and on the right at C5-6 most marked at C4-5. No jumped appearing facets. Upper chest: No acute abnormality of the visualized lung apices. Other: Extracranial carotid calcifications bilaterally. IMPRESSION: 1. Chronic stable cerebral atrophy with small vessel ischemic disease. No acute intracranial abnormality. 2. 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 No acute osseous abnormality. Electronically Signed   By: Tollie Eth M.D.   On: 11/28/2016 20:34   Ct Cervical Spine Wo Contrast  Result Date: 11/28/2016 CLINICAL DATA:  Dementia. Possible loss of consciousness after fall. EXAM: CT HEAD WITHOUT CONTRAST CT CERVICAL SPINE WITHOUT CONTRAST TECHNIQUE: Multidetector CT imaging of the head and cervical spine was performed following the standard protocol without intravenous contrast. Multiplanar CT image reconstructions of the cervical spine were also generated. COMPARISON:  11/26/2016 FINDINGS: CT HEAD  FINDINGS BRAIN: There is sulcal and ventricular prominence consistent with superficial and central atrophy. No intraparenchymal hemorrhage, mass effect nor midline shift. Periventricular and subcortical white matter hypodensities consistent with chronic small vessel ischemic disease are identified. No acute large vascular territory infarcts. No abnormal extra-axial fluid collections. Basal cisterns are not effaced and midline. VASCULAR: No hyperdense vessels. Left carotid siphon calcifications. SKULL: No skull fracture. No significant scalp soft tissue swelling. SINUSES/ORBITS: The mastoid air-cells are clear. The included paranasal sinuses are well-aerated. Minimal mucosal thickening of the ethmoid sinus.The included ocular globes and orbital contents are non-suspicious. OTHER: None. CT CERVICAL SPINE FINDINGS Alignment: Slight reversal cervical lordosis with apex at C5-6 secondary to degenerative disc disease. Skull base and vertebrae: No vertebral body fracture. Intact skullbase. The craniocervical relationship is maintained. Soft tissues and spinal canal: No prevertebral soft tissue swelling. No intraspinal hemorrhage. Disc levels: Moderate to severe disc space narrowing at C5-6 and C6-7 with small posterior marginal osteophytes. Moderate disc space narrowing is also noted at C4-5. Multilevel facet hypertrophy with foraminal narrowing on the right at C3-4, C4-5 bilaterally and on the right at C5-6 most marked at C4-5. No jumped appearing facets. Upper chest: No acute abnormality of the visualized lung apices. Other: Extracranial carotid calcifications bilaterally. IMPRESSION: 1. Chronic stable cerebral atrophy with small vessel ischemic disease. No acute intracranial abnormality. 2. 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 No acute osseous abnormality. Electronically Signed   By: Tollie Eth M.D.   On: 11/28/2016 20:34     ____________________________________________   PROCEDURES  Procedure(s) performed:   Procedures  None ____________________________________________   INITIAL IMPRESSION / ASSESSMENT AND PLAN / ED COURSE  Pertinent labs & imaging results that were available during my care of the  patient were reviewed by me and considered in my medical decision making (see chart for details).  Patient resents to the emergency department for evaluation after being found down at the nursing home. He has no obvious signs of trauma on my exam. He denies any pain. History and review of systems is significantly limited by his underlying dementia. Plan for CT scan of the head and neck. Full range of motion of both hips, knees, ankles. Soft and nontender abdomen. Normal vital signs and cardiorespiratory exam  CT head and c-spine with no acute changes. No exam findings or historical factors to require additional imaging or lab testing. Plan for discharge back to SNF.   At this time, I do not feel there is any life-threatening condition present. I have reviewed and discussed all results (EKG, imaging, lab, urine as appropriate), exam findings with patient. I have reviewed nursing notes and appropriate previous records.  I feel the patient is safe to be discharged home without further emergent workup. Discussed usual and customary return precautions. Patient and family (if present) verbalize understanding and are comfortable with this plan.  Patient will follow-up with their primary care provider. If they do not have a primary care provider, information for follow-up has been provided to them. All questions have been answered.  ____________________________________________  FINAL CLINICAL IMPRESSION(S) / ED DIAGNOSES  Final diagnoses:  Fall, initial encounter  Injury of head, initial encounter  Strain of neck muscle, initial encounter     MEDICATIONS GIVEN DURING THIS VISIT:  None  NEW OUTPATIENT  MEDICATIONS STARTED DURING THIS VISIT:  None   Note:  This document was prepared using Dragon voice recognition software and may include unintentional dictation errors.  Alona Bene, MD Emergency Medicine   Maia Plan, MD 11/29/16 [phone]

## 2016-11-29 ENCOUNTER — Emergency Department (HOSPITAL_COMMUNITY)
Admission: EM | Admit: 2016-11-29 | Discharge: 2016-11-30 | Disposition: A | Discharge status: Home / Self Care | Payer: Medicare Other | Source: Home / Self Care | Attending: Emergency Medicine | Admitting: Emergency Medicine

## 2016-11-29 DIAGNOSIS — F0391 Unspecified dementia with behavioral disturbance: Secondary | ICD-10-CM

## 2016-11-29 DIAGNOSIS — I10 Essential (primary) hypertension: Secondary | ICD-10-CM

## 2016-11-29 DIAGNOSIS — Z79899 Other long term (current) drug therapy: Secondary | ICD-10-CM | POA: Insufficient documentation

## 2016-11-29 DIAGNOSIS — W19XXXA Unspecified fall, initial encounter: Secondary | ICD-10-CM

## 2016-11-29 DIAGNOSIS — Y999 Unspecified external cause status: Secondary | ICD-10-CM | POA: Insufficient documentation

## 2016-11-29 DIAGNOSIS — Y92129 Unspecified place in nursing home as the place of occurrence of the external cause: Secondary | ICD-10-CM | POA: Insufficient documentation

## 2016-11-29 DIAGNOSIS — S161XXA Strain of muscle, fascia and tendon at neck level, initial encounter: Secondary | ICD-10-CM | POA: Diagnosis not present

## 2016-11-29 DIAGNOSIS — Z87891 Personal history of nicotine dependence: Secondary | ICD-10-CM

## 2016-11-29 DIAGNOSIS — Y939 Activity, unspecified: Secondary | ICD-10-CM | POA: Insufficient documentation

## 2016-11-29 NOTE — ED Provider Notes (Signed)
[facility]-EMERGENCY DEPT Provider Note   CSN: [id number] Arrival date & time: 11/29/16  1823     History   Chief Complaint Chief Complaint  Patient presents with  . Fall    HPI Steve Hood is a 76 y.o. male.  Level V caveat for dementia. Patient resides at [facility]. He apparently had a witnessed accidental fall today. He is here to be "checked out". He has no complaints of pain. No obvious head injury, neck injury, extremity trauma, pelvic tenderness. He is ambulatory.      Past Medical History:  Diagnosis Date  . Alzheimer disease   . Benign prostate hyperplasia   . Dementia alzh  . High cholesterol   . Hypertension   . Thyroid disease     Patient Active Problem List   Diagnosis Date Noted  . 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 Aggressive behavior     No past surgical history on file.     Home Medications    Prior to Admission medications   Medication Sig Start Date End Date Taking? Authorizing Provider  ALPRAZolam Prudy Feeler) 1 MG tablet Take 1 mg by mouth every 8 (eight) hours as needed (anxiety/agitation).  08/11/14  Yes Historical Provider, MD  citalopram (CELEXA) 20 MG tablet Take 20 mg by mouth daily. (0800)   Yes Historical Provider, MD  clonazePAM (KLONOPIN) 0.25 MG disintegrating tablet Take 0.25 mg by mouth daily.   Yes Historical Provider, MD  divalproex (DEPAKOTE ER) 500 MG 24 hr tablet Take 500 mg by mouth at bedtime. (2000)   Yes Historical Provider, MD  docusate sodium (COLACE) 100 MG capsule Take 100 mg by mouth 2 (two) times daily. (0800 & 2000)   Yes Historical Provider, MD  erythromycin ophthalmic ointment Place 1 application into the right eye 3 (three) times daily.   Yes Historical Provider, MD  finasteride (PROSCAR) 5 MG tablet Take 5 mg by mouth daily. (0800)   Yes Historical Provider, MD  furosemide (LASIX) 20 MG tablet Take 10 mg by mouth daily. (0800)   Yes Historical Provider, MD  levothyroxine (SYNTHROID,  LEVOTHROID) 125 MCG tablet Take 125 mcg by mouth daily at 6 (six) AM.    Yes Historical Provider, MD  lisinopril (PRINIVIL,ZESTRIL) 5 MG tablet Take 5 mg by mouth daily. (0800)   Yes Historical Provider, MD  mirtazapine (REMERON) 15 MG tablet Take 15 mg by mouth at bedtime. (2000)   Yes Historical Provider, MD  NUTRITIONAL SUPPLEMENT LIQD Take 1 Dose by mouth 3 (three) times daily. "House shake." (1000, 1400, & 2000)   Yes Historical Provider, MD  risperiDONE (RISPERDAL) 0.5 MG tablet Take 0.25-0.5 mg by mouth 2 (two) times daily. (0800 & 2000) TAKE 0.5 TABLET (0.25 MG) BY MOUTH IN THE MORNING & TAKE 1 TABLET (0.5 MG) BY MOUTH AT BEDTIME.   Yes Historical Provider, MD  tamsulosin (FLOMAX) 0.4 MG CAPS capsule Take 0.4 mg by mouth daily. (0800)   Yes Historical Provider, MD    Family History No family history on file.  Social History Social History  Substance Use Topics  . Smoking status: Former Games developer  . Smokeless tobacco: Never Used  . Alcohol use No     Allergies   Patient has no known allergies.   Review of Systems Review of Systems  Reason unable to perform ROS: dementia.     Physical Exam Updated Vital Signs BP 105/68 (BP Location: Left Arm)   Pulse 72   Temp 98.4 F (36.9 C) (  Oral)   Resp 14   SpO2 95%   Physical Exam  Constitutional:  NAD  HENT:  Head: Normocephalic and atraumatic.  Eyes: Conjunctivae are normal.  Neck: Neck supple.  Cardiovascular: Normal rate and regular rhythm.   Pulmonary/Chest: Effort normal and breath sounds normal.  Abdominal: Soft. Bowel sounds are normal.  Musculoskeletal: Normal range of motion.  Neurological: He is alert.  Ambulatory without deficits.  Skin: Skin is warm and dry.  Psychiatric:  demented  Nursing note and vitals reviewed.    ED Treatments / Results  Labs (all labs ordered are listed, but only abnormal results are displayed) Labs Reviewed - No data to display  EKG  EKG Interpretation None        Radiology Ct Head Wo Contrast  Result Date: 11/28/2016 CLINICAL DATA:  Dementia. Possible loss of consciousness after fall. EXAM: CT HEAD WITHOUT CONTRAST CT CERVICAL SPINE WITHOUT CONTRAST TECHNIQUE: Multidetector CT imaging of the head and cervical spine was performed following the standard protocol without intravenous contrast. Multiplanar CT image reconstructions of the cervical spine were also generated. COMPARISON:  11/26/2016 FINDINGS: CT HEAD FINDINGS BRAIN: There is sulcal and ventricular prominence consistent with superficial and central atrophy. No intraparenchymal hemorrhage, mass effect nor midline shift. Periventricular and subcortical white matter hypodensities consistent with chronic small vessel ischemic disease are identified. No acute large vascular territory infarcts. No abnormal extra-axial fluid collections. Basal cisterns are not effaced and midline. VASCULAR: No hyperdense vessels. Left carotid siphon calcifications. SKULL: No skull fracture. No significant scalp soft tissue swelling. SINUSES/ORBITS: The mastoid air-cells are clear. The included paranasal sinuses are well-aerated. Minimal mucosal thickening of the ethmoid sinus.The included ocular globes and orbital contents are non-suspicious. OTHER: None. CT CERVICAL SPINE FINDINGS Alignment: Slight reversal cervical lordosis with apex at C5-6 secondary to degenerative disc disease. Skull base and vertebrae: No vertebral body fracture. Intact skullbase. The craniocervical relationship is maintained. Soft tissues and spinal canal: No prevertebral soft tissue swelling. No intraspinal hemorrhage. Disc levels: Moderate to severe disc space narrowing at C5-6 and C6-7 with small posterior marginal osteophytes. Moderate disc space narrowing is also noted at C4-5. Multilevel facet hypertrophy with foraminal narrowing on the right at C3-4, C4-5 bilaterally and on the right at C5-6 most marked at C4-5. No jumped appearing facets. Upper chest:  No acute abnormality of the visualized lung apices. Other: Extracranial carotid calcifications bilaterally. IMPRESSION: 1. Chronic stable cerebral atrophy with small vessel ischemic disease. No acute intracranial abnormality. 2. 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 No acute osseous abnormality. Electronically Signed   By: Tollie Eth M.D.   On: 11/28/2016 20:34   Ct Cervical Spine Wo Contrast  Result Date: 11/28/2016 CLINICAL DATA:  Dementia. Possible loss of consciousness after fall. EXAM: CT HEAD WITHOUT CONTRAST CT CERVICAL SPINE WITHOUT CONTRAST TECHNIQUE: Multidetector CT imaging of the head and cervical spine was performed following the standard protocol without intravenous contrast. Multiplanar CT image reconstructions of the cervical spine were also generated. COMPARISON:  11/26/2016 FINDINGS: CT HEAD FINDINGS BRAIN: There is sulcal and ventricular prominence consistent with superficial and central atrophy. No intraparenchymal hemorrhage, mass effect nor midline shift. Periventricular and subcortical white matter hypodensities consistent with chronic small vessel ischemic disease are identified. No acute large vascular territory infarcts. No abnormal extra-axial fluid collections. Basal cisterns are not effaced and midline. VASCULAR: No hyperdense vessels. Left carotid siphon calcifications. SKULL: No skull fracture. No significant scalp soft tissue swelling. SINUSES/ORBITS: The mastoid air-cells are clear. The  included paranasal sinuses are well-aerated. Minimal mucosal thickening of the ethmoid sinus.The included ocular globes and orbital contents are non-suspicious. OTHER: None. CT CERVICAL SPINE FINDINGS Alignment: Slight reversal cervical lordosis with apex at C5-6 secondary to degenerative disc disease. Skull base and vertebrae: No vertebral body fracture. Intact skullbase. The craniocervical relationship is maintained. Soft tissues and spinal canal: No prevertebral soft  tissue swelling. No intraspinal hemorrhage. Disc levels: Moderate to severe disc space narrowing at C5-6 and C6-7 with small posterior marginal osteophytes. Moderate disc space narrowing is also noted at C4-5. Multilevel facet hypertrophy with foraminal narrowing on the right at C3-4, C4-5 bilaterally and on the right at C5-6 most marked at C4-5. No jumped appearing facets. Upper chest: No acute abnormality of the visualized lung apices. Other: Extracranial carotid calcifications bilaterally. IMPRESSION: 1. Chronic stable cerebral atrophy with small vessel ischemic disease. No acute intracranial abnormality. 2. 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 No acute osseous abnormality. Electronically Signed   By: Tollie Eth M.D.   On: 11/28/2016 20:34    Procedures Procedures (including critical care time)  Medications Ordered in ED Medications - No data to display   Initial Impression / Assessment and Plan / ED Course  I have reviewed the triage vital signs and the nursing notes.  Pertinent labs & imaging results that were available during my care of the patient were reviewed by me and considered in my medical decision making (see chart for details).     Patient fell today. He is ambulatory in the ED without deficits. He was seen yesterday here for the same issues. CT head and CT cervical spine at that time were negative. Patient will be returned to the nursing home.  Final Clinical Impressions(s) / ED Diagnoses   Final diagnoses:  Fall, initial encounter  Dementia with behavioral disturbance, unspecified dementia type    New Prescriptions New Prescriptions   No medications on file     Donnetta Hutching, MD 11/29/16 2058

## 2016-11-29 NOTE — Discharge Instructions (Signed)
Patient was ambulatory and did not have any obvious bony pain. No head or neck injury. Patient can return to the nursing home.

## 2016-11-29 NOTE — ED Notes (Signed)
[facility] called 2nd time and stated no ETA at this time for pt pick up

## 2016-11-29 NOTE — ED Notes (Signed)
Bed: [facility] Expected date:  Expected time:  Means of arrival:  Comments: No bed  

## 2016-11-29 NOTE — ED Triage Notes (Signed)
Pt is presented by EMS from [facility] place for evaluation post a witnessed fall per facility protocol. Noted hx of dementia, no reported LOC, head injury or anticoagulation therapy.

## 2017-07-17 IMAGING — CT CT CERVICAL SPINE W/O CM
4 of 8 series · 13 of 33 positions shown, 14 images · non-contrast
Comparison: 09/18/2016

CLINICAL DATA: Unwitnessed fall, found lying on floor, history of
Alzheimer disease, hypertension, former smoker

EXAM:
CT HEAD WITHOUT CONTRAST
CT CERVICAL SPINE WITHOUT CONTRAST
TECHNIQUE: Multidetector CT imaging of the head and cervical spine was
performed following the standard protocol without intravenous
contrast. Multiplanar CT image reconstructions of the cervical spine
were also generated.

[Series 6: c-spine st · axial · 0.33mm/px · z∈[-220,-166]mm · 2 of 81 slices shown]
[im 27/81  bone]
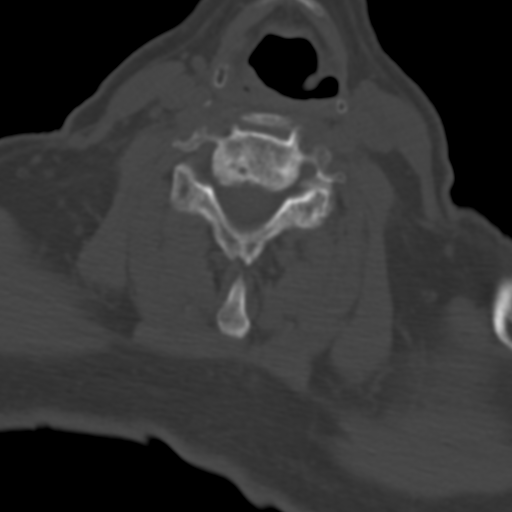
[im 54/81  bone]
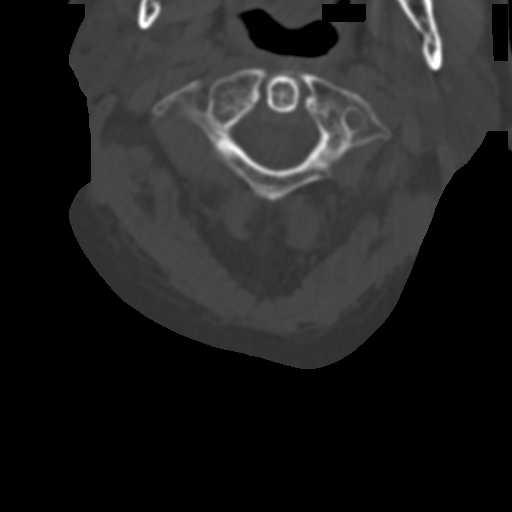

[Series 9: coronal · coronal · 0.32mm/px · 2 of 48 slices shown]
[im 16/48  bone]
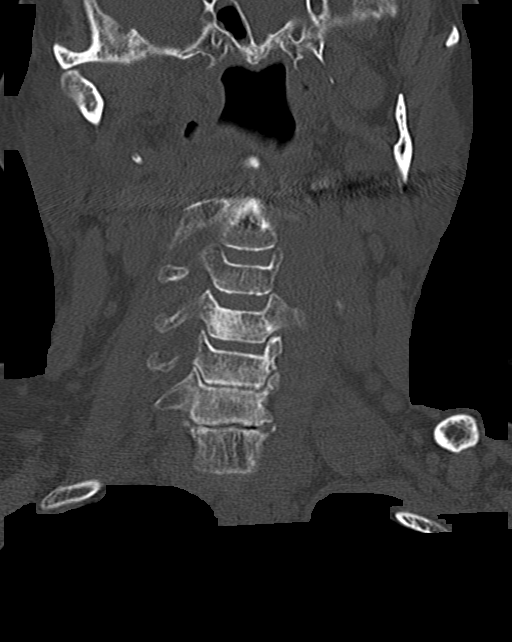
[im 32/48  bone]
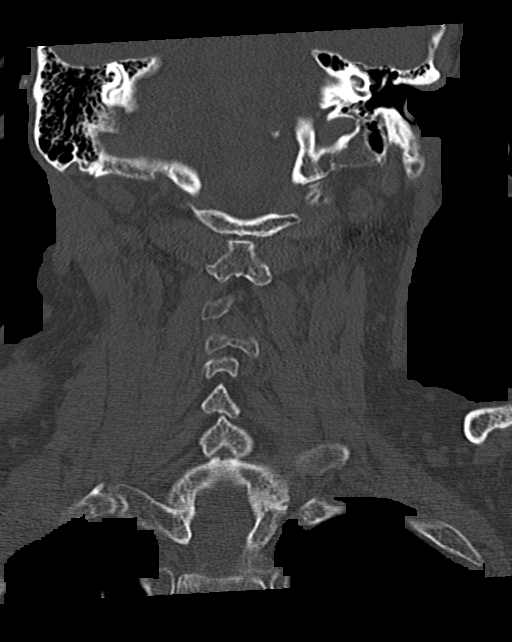

[Series 10: sagittal · sagittal · 0.23mm/px · 5 of 61 slices shown]
[im 11/61  bone]
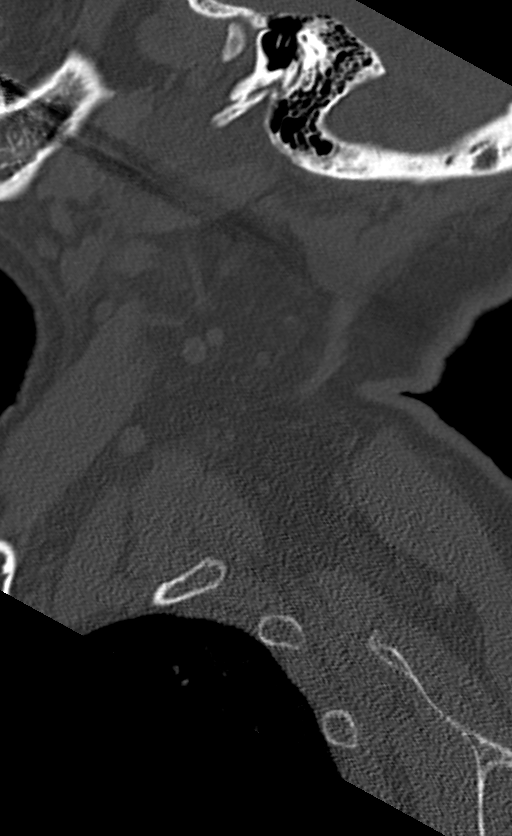
[im 21/61  bone]
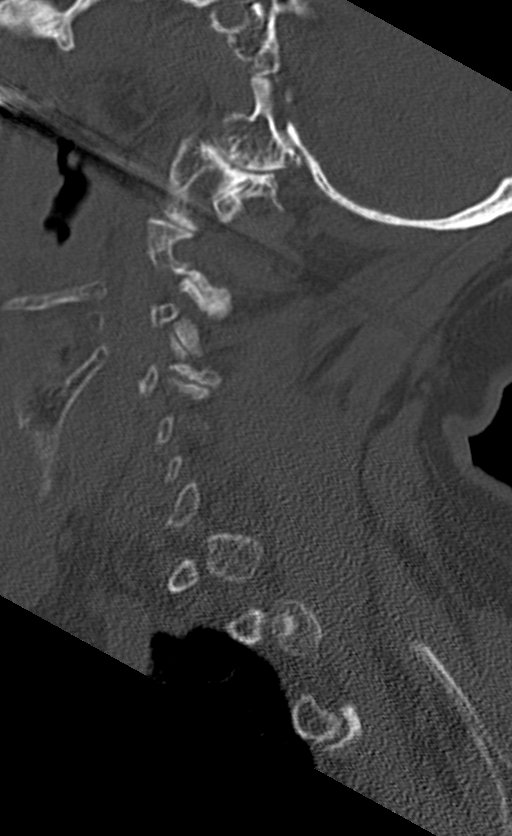
[im 31/61  bone]
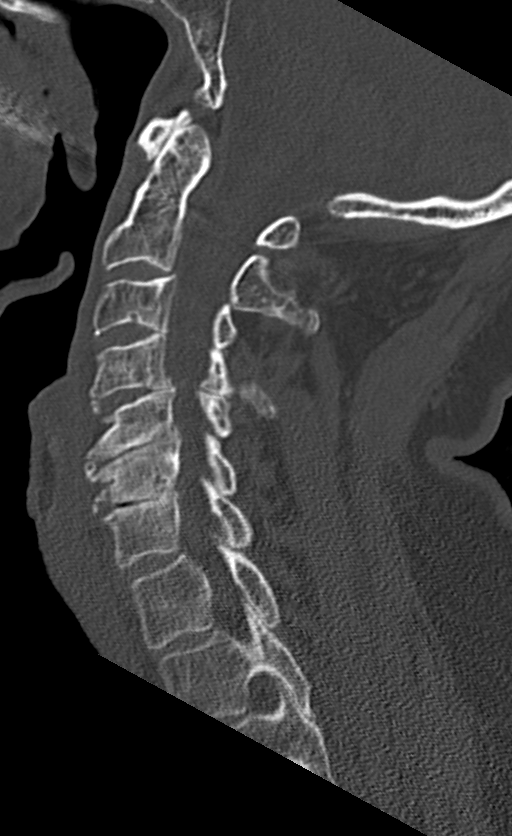
[im 41/61  bone]
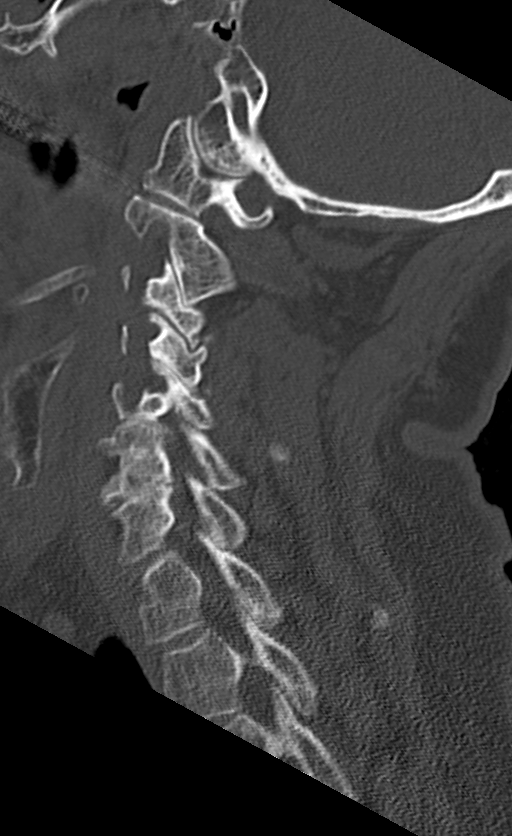
[im 51/61  bone]
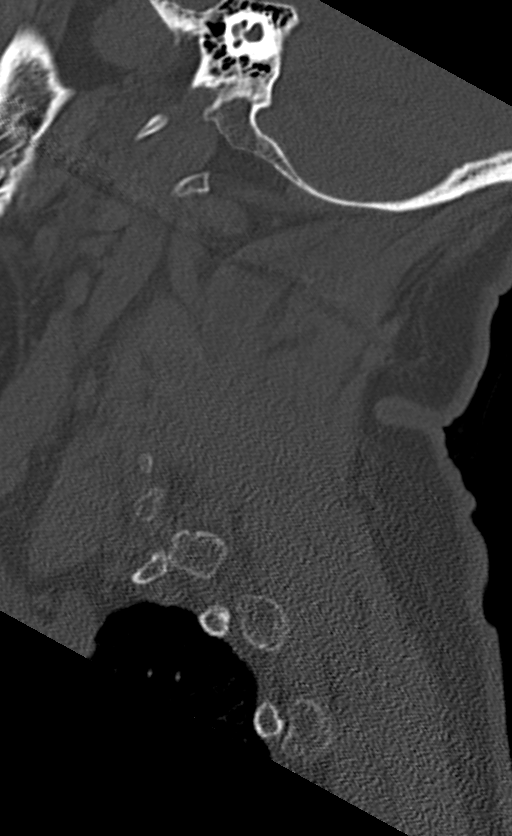

[Series 11: axial recon · axial · 0.27mm/px · z∈[-291,-186]mm · 4 of 104 slices shown, 5 images]
[im 21/104  soft-tissue]
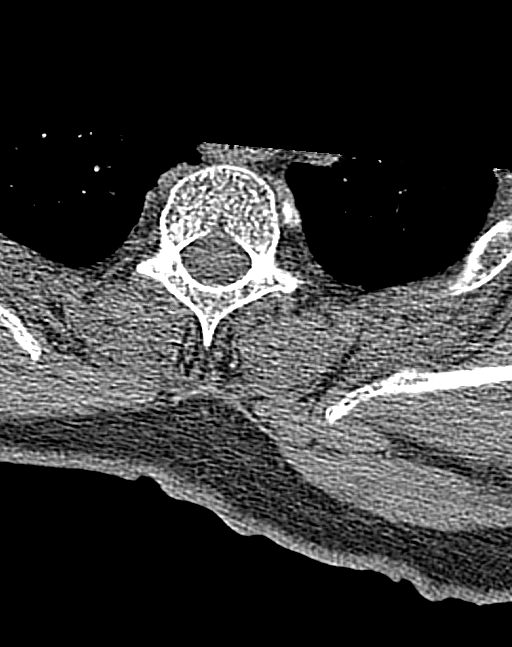
[im 21/104  bone]
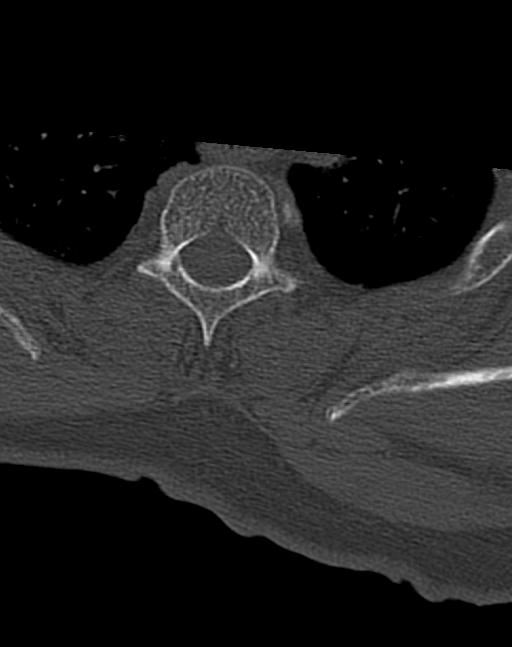
[im 42/104  bone]
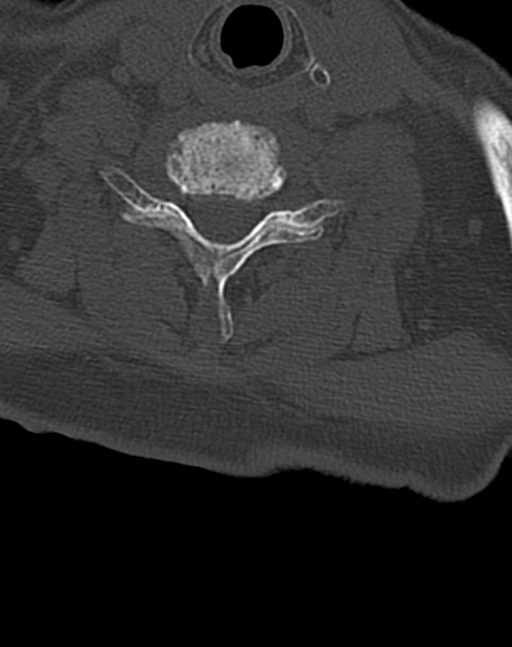
[im 62/104  bone]
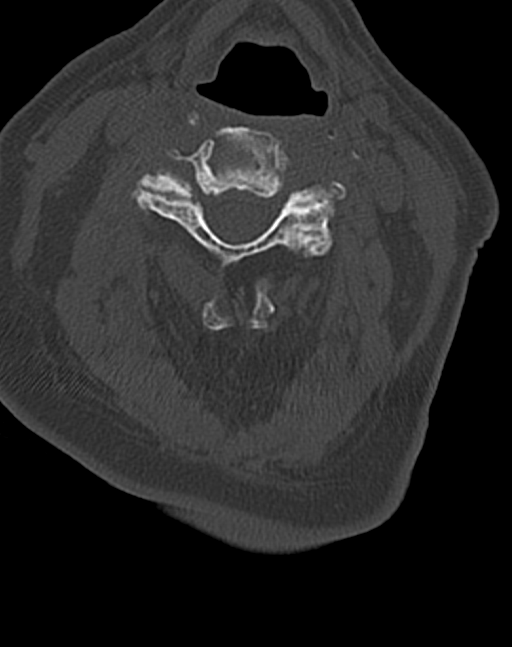
[im 83/104  bone]
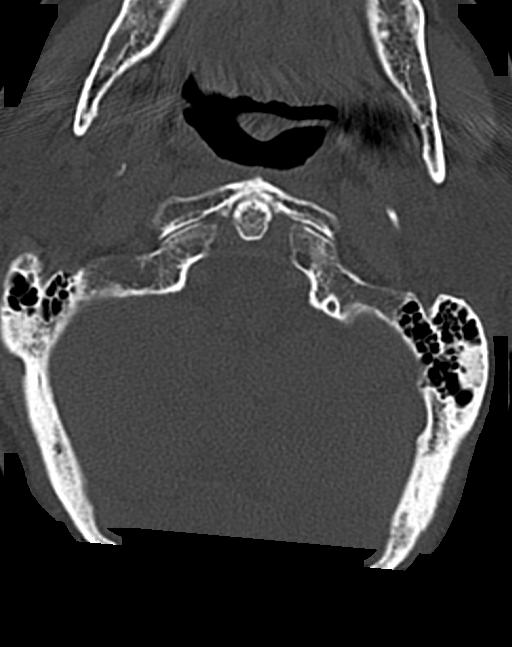

[13 of 33 positions shown; findings below may reference images not displayed]

FINDINGS: CT HEAD FINDINGS

Brain: Generalized atrophy. Normal ventricular morphology. No
midline shift or mass effect. Minimal small vessel chronic ischemic
changes of deep cerebral white matter. No intracranial hemorrhage,
mass lesion, evidence of acute infarction, or extra-axial fluid
collection.

Vascular: Atherosclerotic calcifications at the carotid siphons and
vertebral arteries bilaterally at the skullbase.

Skull: Demineralized but intact

Sinuses/Orbits: Coastal thickening in maxillary sinuses and a few
ethmoid air cells bilaterally. Opacified sphenoid sinus. Mastoid air
cells clear. Small amount of fluid within a LEFT ethmoid air cell.

Other: N/A

CT CERVICAL SPINE FINDINGS

Alignment: Minimal anterolisthesis C7-T1 unchanged. Remaining
alignments grossly normal.

Skull base and vertebrae: Visualized skullbase intact. Osseous
demineralization. Scattered facet degenerative changes. Vertebral
body heights maintained without fracture or bone destruction.
Sclerosis within the RIGHT C4 vertebral body unchanged.

Soft tissues and spinal canal: Prevertebral soft tissues normal
thickness. Scattered normal size cervical lymph nodes. Soft tissues
otherwise unremarkable.

Disc levels: Disc space narrowing with endplate spur formation C4-C5
through C6-C7. Encroachment upon cervical neural foramina
bilaterally by small uncovertebral spurs and facet hypertrophy at
multiple levels, greatest at LEFT C4-C5.

Upper chest: Lung apices clear

Other: Atherosclerotic calcifications of the common carotid arteries
bilaterally.
IMPRESSION: Atrophy with minimal small vessel chronic ischemic changes of deep
cerebral white matter.

No acute intracranial abnormalities.

Scattered sinus disease changes including new opacification of the
sphenoid sinus question sinusitis.

Multilevel degenerative disc and facet disease changes of the
cervical spine.

No acute cervical spine abnormalities.

## 2018-12-25 DEATH — deceased
